# Patient Record
Sex: Male | Born: 1945
Health system: Southern US, Community
[De-identification: ages and names within clinical notes are randomized; demographics above are authoritative.]

## PROBLEM LIST (undated history)

## (undated) DIAGNOSIS — R0683 Snoring: Secondary | ICD-10-CM

## (undated) DIAGNOSIS — Z460 Encounter for fitting and adjustment of spectacles and contact lenses: Secondary | ICD-10-CM

## (undated) DIAGNOSIS — C9110 Chronic lymphocytic leukemia of B-cell type not having achieved remission: Secondary | ICD-10-CM

## (undated) DIAGNOSIS — E785 Hyperlipidemia, unspecified: Secondary | ICD-10-CM

## (undated) DIAGNOSIS — I10 Essential (primary) hypertension: Secondary | ICD-10-CM

## (undated) DIAGNOSIS — K219 Gastro-esophageal reflux disease without esophagitis: Secondary | ICD-10-CM

## (undated) DIAGNOSIS — D72820 Lymphocytosis (symptomatic): Principal | ICD-10-CM

## (undated) DIAGNOSIS — H269 Unspecified cataract: Secondary | ICD-10-CM

## (undated) HISTORY — DX: Lymphocytosis (symptomatic): D72.820

## (undated) HISTORY — DX: Unspecified cataract: H26.9

## (undated) HISTORY — DX: Essential (primary) hypertension: I10

## (undated) HISTORY — PX: COLONOSCOPY: SHX174

## (undated) HISTORY — DX: Hyperlipidemia, unspecified: E78.5

## (undated) HISTORY — DX: Chronic lymphocytic leukemia of B-cell type not having achieved remission: C91.10

## (undated) HISTORY — PX: TONSILLECTOMY: SUR1361

---

## 2008-07-19 ENCOUNTER — Ambulatory Visit: Payer: Self-pay | Admitting: Oncology

## 2008-08-06 ENCOUNTER — Other Ambulatory Visit: Admission: RE | Admit: 2008-08-06 | Discharge: 2008-08-06 | Payer: Self-pay | Admitting: Oncology

## 2008-08-06 ENCOUNTER — Encounter (HOSPITAL_COMMUNITY): Payer: Self-pay | Admitting: Oncology

## 2008-08-06 LAB — CBC WITH DIFFERENTIAL/PLATELET
BASO%: 0.3 % (ref 0.0–2.0)
Eosinophils Absolute: 0.2 10*3/uL (ref 0.0–0.5)
LYMPH%: 42.2 % (ref 14.0–48.0)
MONO#: 0.7 10*3/uL (ref 0.1–0.9)
NEUT#: 4.8 10*3/uL (ref 1.5–6.5)
Platelets: 218 10*3/uL (ref 145–400)
RBC: 4.79 10*6/uL (ref 4.20–5.71)
RDW: 13.3 % (ref 11.2–14.6)
WBC: 9.8 10*3/uL (ref 4.0–10.0)
lymph#: 4.1 10*3/uL — ABNORMAL HIGH (ref 0.9–3.3)

## 2008-08-06 LAB — COMPREHENSIVE METABOLIC PANEL
ALT: 15 U/L (ref 0–53)
AST: 18 U/L (ref 0–37)
Albumin: 4.5 g/dL (ref 3.5–5.2)
Alkaline Phosphatase: 57 U/L (ref 39–117)
Calcium: 9.4 mg/dL (ref 8.4–10.5)
Chloride: 105 mEq/L (ref 96–112)
Potassium: 4.2 mEq/L (ref 3.5–5.3)
Sodium: 142 mEq/L (ref 135–145)

## 2008-08-06 LAB — CHCC SMEAR

## 2008-08-14 LAB — FLOW CYTOMETRY

## 2008-10-03 ENCOUNTER — Ambulatory Visit: Payer: Self-pay | Admitting: Oncology

## 2008-10-07 LAB — CBC WITH DIFFERENTIAL/PLATELET
BASO%: 0.3 % (ref 0.0–2.0)
Basophils Absolute: 0 10*3/uL (ref 0.0–0.1)
HCT: 44.4 % (ref 38.7–49.9)
HGB: 15.1 g/dL (ref 13.0–17.1)
LYMPH%: 44 % (ref 14.0–48.0)
MCHC: 34 g/dL (ref 32.0–35.9)
MONO#: 0.7 10*3/uL (ref 0.1–0.9)
NEUT%: 46.4 % (ref 40.0–75.0)
Platelets: 233 10*3/uL (ref 145–400)
WBC: 12.1 10*3/uL — ABNORMAL HIGH (ref 4.0–10.0)

## 2008-12-03 ENCOUNTER — Ambulatory Visit: Payer: Self-pay | Admitting: Oncology

## 2008-12-05 LAB — CBC WITH DIFFERENTIAL/PLATELET
BASO%: 0.2 % (ref 0.0–2.0)
LYMPH%: 42.8 % (ref 14.0–49.0)
MCHC: 34 g/dL (ref 32.0–36.0)
MCV: 93.2 fL (ref 79.3–98.0)
MONO%: 4.8 % (ref 0.0–14.0)
Platelets: 222 10*3/uL (ref 140–400)
RBC: 4.57 10*6/uL (ref 4.20–5.82)

## 2008-12-05 LAB — COMPREHENSIVE METABOLIC PANEL
ALT: 14 U/L (ref 0–53)
AST: 16 U/L (ref 0–37)
CO2: 20 mEq/L (ref 19–32)
Creatinine, Ser: 1.52 mg/dL — ABNORMAL HIGH (ref 0.40–1.50)
Total Bilirubin: 0.7 mg/dL (ref 0.3–1.2)

## 2008-12-05 LAB — LACTATE DEHYDROGENASE: LDH: 166 U/L (ref 94–250)

## 2008-12-11 ENCOUNTER — Ambulatory Visit: Payer: Self-pay | Admitting: Internal Medicine

## 2008-12-11 DIAGNOSIS — C9110 Chronic lymphocytic leukemia of B-cell type not having achieved remission: Secondary | ICD-10-CM | POA: Insufficient documentation

## 2008-12-11 DIAGNOSIS — E785 Hyperlipidemia, unspecified: Secondary | ICD-10-CM

## 2008-12-11 DIAGNOSIS — I10 Essential (primary) hypertension: Secondary | ICD-10-CM | POA: Insufficient documentation

## 2009-02-25 ENCOUNTER — Ambulatory Visit: Payer: Self-pay | Admitting: Oncology

## 2009-02-27 LAB — CBC WITH DIFFERENTIAL/PLATELET
Basophils Absolute: 0 10*3/uL (ref 0.0–0.1)
Eosinophils Absolute: 0.1 10*3/uL (ref 0.0–0.5)
HGB: 14.4 g/dL (ref 13.0–17.1)
LYMPH%: 45 % (ref 14.0–49.0)
MCV: 93.6 fL (ref 79.3–98.0)
MONO%: 6.2 % (ref 0.0–14.0)
NEUT#: 3.9 10*3/uL (ref 1.5–6.5)
Platelets: 214 10*3/uL (ref 140–400)

## 2009-03-03 ENCOUNTER — Ambulatory Visit: Payer: Self-pay | Admitting: Internal Medicine

## 2009-03-03 DIAGNOSIS — K5900 Constipation, unspecified: Secondary | ICD-10-CM | POA: Insufficient documentation

## 2009-03-03 DIAGNOSIS — N400 Enlarged prostate without lower urinary tract symptoms: Secondary | ICD-10-CM

## 2009-05-23 ENCOUNTER — Ambulatory Visit: Payer: Self-pay | Admitting: Oncology

## 2009-05-27 LAB — CBC WITH DIFFERENTIAL/PLATELET
Basophils Absolute: 0 10*3/uL (ref 0.0–0.1)
EOS%: 0.9 % (ref 0.0–7.0)
HGB: 14.8 g/dL (ref 13.0–17.1)
LYMPH%: 43.6 % (ref 14.0–49.0)
MCH: 32.1 pg (ref 27.2–33.4)
MCV: 94 fL (ref 79.3–98.0)
MONO%: 5.8 % (ref 0.0–14.0)
NEUT%: 49.4 % (ref 39.0–75.0)
RDW: 13.5 % (ref 11.0–14.6)

## 2009-05-27 LAB — COMPREHENSIVE METABOLIC PANEL
AST: 16 U/L (ref 0–37)
Alkaline Phosphatase: 54 U/L (ref 39–117)
BUN: 24 mg/dL — ABNORMAL HIGH (ref 6–23)
Creatinine, Ser: 1.46 mg/dL (ref 0.40–1.50)
Potassium: 4.5 mEq/L (ref 3.5–5.3)
Total Bilirubin: 0.6 mg/dL (ref 0.3–1.2)

## 2009-08-22 ENCOUNTER — Ambulatory Visit: Payer: Self-pay | Admitting: Oncology

## 2009-08-26 LAB — CBC WITH DIFFERENTIAL/PLATELET
Basophils Absolute: 0 10*3/uL (ref 0.0–0.1)
EOS%: 1.6 % (ref 0.0–7.0)
HCT: 44.3 % (ref 38.4–49.9)
HGB: 14.9 g/dL (ref 13.0–17.1)
MCH: 32.4 pg (ref 27.2–33.4)
MCV: 96.2 fL (ref 79.3–98.0)
MONO%: 4.9 % (ref 0.0–14.0)
NEUT%: 50.2 % (ref 39.0–75.0)
lymph#: 4.1 10*3/uL — ABNORMAL HIGH (ref 0.9–3.3)

## 2009-11-03 ENCOUNTER — Ambulatory Visit: Payer: Self-pay | Admitting: Internal Medicine

## 2009-11-03 DIAGNOSIS — R05 Cough: Secondary | ICD-10-CM

## 2009-11-06 ENCOUNTER — Ambulatory Visit: Payer: Self-pay | Admitting: Oncology

## 2009-11-10 LAB — CBC WITH DIFFERENTIAL/PLATELET
Basophils Absolute: 0 10*3/uL (ref 0.0–0.1)
Eosinophils Absolute: 0.1 10*3/uL (ref 0.0–0.5)
HCT: 43.7 % (ref 38.4–49.9)
HGB: 14.6 g/dL (ref 13.0–17.1)
LYMPH%: 44.7 % (ref 14.0–49.0)
MCV: 93.5 fL (ref 79.3–98.0)
MONO#: 0.7 10*3/uL (ref 0.1–0.9)
MONO%: 6 % (ref 0.0–14.0)
NEUT#: 5.6 10*3/uL (ref 1.5–6.5)
NEUT%: 48.1 % (ref 39.0–75.0)
Platelets: 318 10*3/uL (ref 140–400)
RBC: 4.67 10*6/uL (ref 4.20–5.82)
WBC: 11.6 10*3/uL — ABNORMAL HIGH (ref 4.0–10.3)

## 2009-11-10 LAB — COMPREHENSIVE METABOLIC PANEL
Alkaline Phosphatase: 58 U/L (ref 39–117)
BUN: 24 mg/dL — ABNORMAL HIGH (ref 6–23)
CO2: 22 mEq/L (ref 19–32)
Glucose, Bld: 68 mg/dL — ABNORMAL LOW (ref 70–99)
Total Bilirubin: 0.6 mg/dL (ref 0.3–1.2)

## 2009-11-10 LAB — LACTATE DEHYDROGENASE: LDH: 170 U/L (ref 94–250)

## 2010-01-06 ENCOUNTER — Telehealth: Payer: Self-pay | Admitting: Internal Medicine

## 2010-02-16 ENCOUNTER — Ambulatory Visit: Payer: Self-pay | Admitting: Oncology

## 2010-02-19 LAB — CBC WITH DIFFERENTIAL/PLATELET
Basophils Absolute: 0 10*3/uL (ref 0.0–0.1)
Eosinophils Absolute: 0 10*3/uL (ref 0.0–0.5)
HGB: 14.5 g/dL (ref 13.0–17.1)
MCV: 93.5 fL (ref 79.3–98.0)
MONO#: 0.7 10*3/uL (ref 0.1–0.9)
MONO%: 7.1 % (ref 0.0–14.0)
NEUT#: 5.1 10*3/uL (ref 1.5–6.5)
RDW: 14.1 % (ref 11.0–14.6)
WBC: 10.1 10*3/uL (ref 4.0–10.3)

## 2010-05-06 ENCOUNTER — Ambulatory Visit: Payer: Self-pay | Admitting: Oncology

## 2010-05-08 LAB — CBC WITH DIFFERENTIAL/PLATELET
Basophils Absolute: 0 10*3/uL (ref 0.0–0.1)
HCT: 42.4 % (ref 38.4–49.9)
MCH: 32.3 pg (ref 27.2–33.4)
MCHC: 34.3 g/dL (ref 32.0–36.0)
MONO#: 0.6 10*3/uL (ref 0.1–0.9)
MONO%: 6.4 % (ref 0.0–14.0)
NEUT#: 4.8 10*3/uL (ref 1.5–6.5)
Platelets: 219 10*3/uL (ref 140–400)
RDW: 13.3 % (ref 11.0–14.6)
WBC: 8.9 10*3/uL (ref 4.0–10.3)

## 2010-05-08 LAB — COMPREHENSIVE METABOLIC PANEL
Albumin: 4.1 g/dL (ref 3.5–5.2)
CO2: 26 mEq/L (ref 19–32)
Creatinine, Ser: 1.29 mg/dL (ref 0.40–1.50)
Sodium: 140 mEq/L (ref 135–145)
Total Bilirubin: 1.3 mg/dL — ABNORMAL HIGH (ref 0.3–1.2)

## 2010-08-31 ENCOUNTER — Encounter: Payer: Self-pay | Admitting: Internal Medicine

## 2010-08-31 ENCOUNTER — Ambulatory Visit: Payer: Self-pay | Admitting: Internal Medicine

## 2010-08-31 LAB — CONVERTED CEMR LAB
ALT: 17 U/L
AST: 21 U/L
Albumin: 4.2 g/dL
Alkaline Phosphatase: 57 U/L
BUN: 20 mg/dL
Basophils Absolute: 0 10*3/uL
Basophils Relative: 0.3 %
Bilirubin, Direct: 0.1 mg/dL
CO2: 28 meq/L
Calcium: 9.4 mg/dL
Chloride: 104 meq/L
Cholesterol: 162 mg/dL
Creatinine, Ser: 1.2 mg/dL
Eosinophils Absolute: 0.2 10*3/uL
Eosinophils Relative: 2 %
GFR calc non Af Amer: 63.05 mL/min
Glucose, Bld: 88 mg/dL
HCT: 45.1 %
HDL: 34.7 mg/dL — ABNORMAL LOW
Hemoglobin: 15.4 g/dL
LDL Cholesterol: 101 mg/dL — ABNORMAL HIGH
Lymphocytes Relative: 46.4 % — ABNORMAL HIGH
Lymphs Abs: 4.3 10*3/uL — ABNORMAL HIGH
MCHC: 34.1 g/dL
MCV: 94.9 fL
Monocytes Absolute: 0.5 10*3/uL
Monocytes Relative: 5.3 %
Neutro Abs: 4.3 10*3/uL
Neutrophils Relative %: 46 %
PSA: 0.61 ng/mL
Platelets: 217 10*3/uL
Potassium: 4.7 meq/L
RBC: 4.75 M/uL
RDW: 13.3 %
Sodium: 138 meq/L
TSH: 1.42 u[IU]/mL
Total Bilirubin: 1.2 mg/dL
Total CHOL/HDL Ratio: 5
Total Protein: 6.6 g/dL
Triglycerides: 134 mg/dL
VLDL: 26.8 mg/dL
WBC: 9.3 10*3/uL

## 2010-10-25 LAB — CONVERTED CEMR LAB
Albumin: 4.2 g/dL (ref 3.5–5.2)
BUN: 16 mg/dL (ref 6–23)
Cholesterol: 159 mg/dL (ref 0–200)
Creatinine, Ser: 1.2 mg/dL (ref 0.4–1.5)
GFR calc non Af Amer: 64.56 mL/min (ref >60)
Glucose, Bld: 96 mg/dL (ref 70–99)
Hemoglobin, Urine: NEGATIVE
Ketones, ur: NEGATIVE mg/dL
LDL Cholesterol: 99 mg/dL (ref 0–99)
Specific Gravity, Urine: >=1.03 (ref 1.000–1.030)
TSH: 1.39 microintl units/mL (ref 0.35–5.50)
Total Bilirubin: 0.9 mg/dL (ref 0.3–1.2)
Total Protein, Urine: NEGATIVE mg/dL
Urine Glucose: NEGATIVE mg/dL
Urobilinogen, UA: 0.2 (ref 0.0–1.0)
pH: 5.5 (ref 5.0–8.0)

## 2010-10-27 NOTE — Assessment & Plan Note (Signed)
Summary: cold sx/cd   Vital Signs:  Patient profile:   65 year old male Height:      71 inches Weight:      176 pounds BMI:     24.64 O2 Sat:      96 % on Room air Temp:     99.2 degrees F oral Pulse rate:   79 / minute Pulse rhythm:   regular Resp:     16 per minute BP sitting:   104 / 62  (left arm) Cuff size:   large  Vitals Entered By: Rock Nephew CMA (November 03, 2009 9:55 AM)  O2 Flow:  Room air CC: congestion, headache, fever, cough, URI symptoms Is Patient Diabetic? No   Primary Care Provider:  Etta Grandchild MD  CC:  congestion, headache, fever, cough, and URI symptoms.  History of Present Illness:  URI Symptoms      This is a 65 year old man who presents with URI symptoms.  The symptoms began 3 days ago.  The severity is described as mild.  The patient reports nasal congestion, clear nasal discharge, sore throat, dry cough, and sick contacts, but denies purulent nasal discharge.  Associated symptoms include low-grade fever (<100.5 degrees), use of an antipyretic, and response to antipyretic.  The patient denies stiff neck, dyspnea, wheezing, rash, vomiting, and diarrhea.  The patient also reports headache.  The patient denies muscle aches and severe fatigue.  Risk factors for Strep sinusitis include Strep exposure.  The patient denies the following risk factors for Strep sinusitis: double sickening, tender adenopathy, and absence of cough.    Allergies: No Known Drug Allergies  Past History:  Past Medical History: Reviewed history from 12/11/2008 and no changes required. Hyperlipidemia Hypertension CLL  Past Surgical History: Reviewed history from 12/11/2008 and no changes required. Denies surgical history  Family History: Reviewed history from 12/11/2008 and no changes required. Family History Hypertension Family History Lung cancer  Social History: Reviewed history from 12/11/2008 and no changes required. 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 Married Never Smoked Alcohol use-no Drug use-no Regular exercise-yes  Review of Systems  The patient denies anorexia, fever, weight loss, chest pain, hemoptysis, abdominal pain, enlarged lymph nodes, and angioedema.    Physical Exam  General:  Well-developed,well-nourished,in no acute distress; alert,appropriate and cooperative throughout examination Head:  Normocephalic and atraumatic without obvious abnormalities. No apparent alopecia or balding. Eyes:  No corneal or conjunctival inflammation noted. EOMI. Perrla. Funduscopic exam benign, without hemorrhages, exudates or papilledema. Vision grossly normal. Mouth:  Oral mucosa and oropharynx without lesions or exudates.  Teeth in good repair. Neck:  No deformities, masses, or tenderness noted. Lungs:  Normal respiratory effort, chest expands symmetrically. Lungs are clear to auscultation, no crackles or wheezes. Heart:  Normal rate and regular rhythm. S1 and S2 normal without gallop, murmur, click, rub or other extra sounds. Abdomen:  soft, non-tender, normal bowel sounds, no distention, no masses, no guarding, no rigidity, no rebound tenderness, no abdominal hernia, no inguinal hernia, no hepatomegaly, and no splenomegaly.   Msk:  No deformity or scoliosis noted of thoracic or lumbar spine.   Pulses:  R and L carotid,radial,femoral,dorsalis pedis and posterior tibial pulses are full and equal bilaterally Extremities:  No clubbing, cyanosis, edema, or deformity noted with normal full range of motion of all joints.   Neurologic:  No cranial nerve deficits noted. Station and gait are normal. Plantar reflexes are down-going bilaterally. DTRs are symmetrical throughout. Sensory, motor and  coordinative functions appear intact. Skin:  turgor normal, color normal, no rashes, no suspicious lesions, no ecchymoses, no petechiae, no ulcerations, and no edema.   Cervical Nodes:  No lymphadenopathy noted Axillary Nodes:  No palpable  lymphadenopathy Psych:  Cognition and judgment appear intact. Alert and cooperative with normal attention span and concentration. No apparent delusions, illusions, hallucinations   Impression & Recommendations:  Problem # 1:  COUGH (ICD-786.2) Assessment New  Orders: T-2 View CXR (71020TC)  Problem # 2:  BRONCHITIS-ACUTE (ICD-466.0) Assessment: New  His updated medication list for this problem includes:    Avelox Abc Pack 400 Mg Tabs (Moxifloxacin hcl) ..... One by mouth once daily for 5 days  Complete Medication List: 1)  Amlodipine Besy-benazepril Hcl 5-20 Mg Caps (Amlodipine besy-benazepril hcl) .Marland Kitchen.. 1 once daily 2)  Lipitor 10 Mg Tabs (Atorvastatin calcium) .Marland Kitchen.. 1 once daily 3)  Baby Asa  4)  Avelox Abc Pack 400 Mg Tabs (Moxifloxacin hcl) .... One by mouth once daily for 5 days  Patient Instructions: 1)  Please schedule a follow-up appointment in 2 weeks. 2)  Take your antibiotic as prescribed until ALL of it is gone, but stop if you develop a rash or swelling and contact our office as soon as possible. 3)  Acute bronchitis symptoms for less than 10 days are not helped by antibiotics. take over the counter cough medications. call if no improvment in  5-7 days, sooner if increasing cough, fever, or new symptoms( shortness of breath, chest pain). Prescriptions: AVELOX ABC PACK 400 MG TABS (MOXIFLOXACIN HCL) one by mouth once daily for 5 days  #5 x 0   Entered and Authorized by:   Etta Grandchild MD   Signed by:   Etta Grandchild MD on 11/03/2009   Method used:   Samples Given   RxID:   [id number]

## 2010-10-27 NOTE — Assessment & Plan Note (Signed)
Summary: CPX/ LABS AFTER/ BCBS /NWS   Vital Signs:  Patient profile:   65 year old male Height:      71 inches Weight:      174 pounds BMI:     24.36 O2 Sat:      96 % on Room air Temp:     97.6 degrees F oral Pulse rate:   56 / minute Pulse rhythm:   regular Resp:     16 per minute BP sitting:   120 / 70  (left arm) Cuff size:   large  Vitals Entered By: Rock Nephew CMA (August 31, 2010 8:50 AM)  O2 Flow:  Room air CC: CPX w/labs, Hypertension Management, Lipid Management, Preventive Care Is Patient Diabetic? No Pain Assessment Patient in pain? no       Does patient need assistance? Functional Status Self care Ambulation Normal   Primary Care Provider:  Etta Grandchild MD  CC:  CPX w/labs, Hypertension Management, Lipid Management, and Preventive Care.  History of Present Illness: Here for Medicare AWV:  1.   Risk factors based on Past M, S, F history: yes 2.   Physical Activities: very active 3.   Depression/mood: his mood is very good 4.   Hearing: he hears the whispered voice at 3 feet 5.   ADL's: thorough and independent 6.   Fall Risk: none noted 7.   Home Safety: very good 8.   Height, weight, &visual acuity: done 9.   Counseling: yes 10.   Labs ordered based on risk factors: done 11.           Referral Coordination: none at this time 12.           Care Plan: completed 13.            Cognitive Assessment : he responds appropriately to all questions  Hypertension History:      He denies headache, chest pain, palpitations, dyspnea with exertion, orthopnea, PND, peripheral edema, visual symptoms, neurologic problems, syncope, and side effects from treatment.  He notes no problems with any antihypertensive medication side effects.        Positive major cardiovascular risk factors include male age 49 years old or older, hyperlipidemia, and hypertension.  Negative major cardiovascular risk factors include no history of diabetes, negative family history  for ischemic heart disease, and non-tobacco-user status.        Further assessment for target organ damage reveals no history of ASHD, cardiac end-organ damage (CHF/LVH), stroke/TIA, peripheral vascular disease, renal insufficiency, or hypertensive retinopathy.    Lipid Management History:      Positive NCEP/ATP III risk factors include male age 44 years old or older, HDL cholesterol less than 40, and hypertension.  Negative NCEP/ATP III risk factors include non-diabetic, no family history for ischemic heart disease, non-tobacco-user status, no ASHD (atherosclerotic heart disease), no prior stroke/TIA, no peripheral vascular disease, and no history of aortic aneurysm.        The patient states that he knows about the "Therapeutic Lifestyle Change" diet.  His compliance with the TLC diet is excellent.  The patient expresses understanding of adjunctive measures for cholesterol lowering.  Adjunctive measures started by the patient include aerobic exercise, fiber, ASA, omega-3 supplements, limit alcohol consumpton, and weight reduction.  He expresses no side effects from his lipid-lowering medication.  The patient denies any symptoms to suggest myopathy or liver disease.      Current Medications (verified): 1)  Amlodipine Besy-Benazepril Hcl 5-20 Mg  Caps (Amlodipine Besy-Benazepril Hcl) .Marland Kitchen.. 1 Once Daily 2)  Lipitor 10 Mg Tabs (Atorvastatin Calcium) .Marland Kitchen.. 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 Family History Hypertension Family History Lung cancer  Social History: Reviewed history from 12/11/2008 and no changes required. 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   Heme:  Denies abnormal bruising, bleeding, enlarge lymph nodes, fevers, pallor, and skin discoloration.  Physical Exam  General:  Well-developed,well-nourished,in no acute distress; alert,appropriate and cooperative throughout examination Head:  Normocephalic and atraumatic without obvious abnormalities. No apparent alopecia or balding. Eyes:  No corneal or conjunctival inflammation noted. EOMI. Perrla. Funduscopic exam benign, without hemorrhages, exudates or papilledema. Vision grossly normal. Mouth:  Oral mucosa and oropharynx without lesions or exudates.  Teeth in good repair. Neck:  No deformities, masses, or tenderness noted. Breasts:  No masses or gynecomastia noted Lungs:  Normal respiratory effort, chest expands symmetrically. Lungs are clear to auscultation, no crackles or wheezes. Heart:  Normal rate and regular rhythm. S1 and S2 normal without gallop, murmur, click, rub or other extra sounds. Abdomen:  soft, non-tender, normal bowel sounds, no distention, no masses, no guarding, no rigidity, no rebound tenderness, no abdominal hernia, no inguinal hernia, no hepatomegaly, and no splenomegaly.   Rectal:  No external abnormalities noted. Normal sphincter  tone. No rectal masses or tenderness. Genitalia:  circumcised, no hydrocele, no varicocele, no scrotal masses, no testicular masses or atrophy, no cutaneous lesions, and no urethral discharge.   Prostate:  no nodules, no asymmetry, no induration, and 1+ enlarged.   Msk:  normal ROM, no joint tenderness, no joint swelling, no joint warmth, no redness over joints, no joint deformities, no joint instability, and no crepitation.   Pulses:  R and L carotid,radial,femoral,dorsalis pedis and posterior tibial pulses are full and equal bilaterally Extremities:  No clubbing, cyanosis, edema, or deformity noted with normal full range of motion of all joints.   Neurologic:  No cranial nerve deficits noted. Station and gait are normal. Plantar reflexes are down-going bilaterally. DTRs are symmetrical throughout. Sensory, motor and coordinative functions appear intact. Skin:  turgor normal, color normal, no rashes, no suspicious lesions, no ecchymoses, no petechiae, no purpura, no ulcerations, no edema, excessive tan, and solar damage.   Cervical Nodes:  no anterior cervical adenopathy and no posterior cervical adenopathy.   Axillary Nodes:  no R axillary adenopathy and no L axillary adenopathy.   Inguinal Nodes:  no R inguinal adenopathy and no L inguinal adenopathy.   Psych:  Cognition and judgment appear intact. Alert and cooperative with normal attention span and concentration. No apparent delusions, illusions, hallucinations   Impression & Recommendations:  Problem # 1:  ROUTINE GENERAL MEDICAL EXAM@HEALTH  CARE FACL (ICD-V70.0) Assessment New  Colonoscopy: Normal (03/09/2006) Td Booster: Td (03/27/2008)   Flu Vax: Fluvax 3+ (08/31/2010)   Pneumovax: Pneumovax (12/11/2008) Chol: 159 (03/03/2009)   HDL: 39.60 (03/03/2009)   LDL: 99 (03/03/2009)   TG: 100.0 (03/03/2009) TSH: 1.39 (03/03/2009)   PSA: 0.56 (03/03/2009)  Discussed using sunscreen, use of alcohol, drug use, self testicular exam, routine  dental care, routine eye care, routine physical exam, seat belts, multiple vitamins, osteoporosis prevention, adequate calcium intake in diet, and recommendations for immunizations.  Discussed exercise and checking cholesterol.  Also recommend checking PSA.  Orders: [facility] -Subsequent Annual Wellness Visit [phone]) DRE [phone]) Prostate / PSA (Medicare) (G0103) Hemoccult Guaiac-1 spec.(in office) (82270)  Problem # 2:  HYPERTROPHY PROSTATE W/UR OBST & OTH LUTS (ICD-600.01) Assessment: Unchanged  Orders: Venipuncture (14782) TLB-Lipid Panel (80061-LIPID) TLB-BMP (Basic Metabolic Panel-BMET) (80048-METABOL) TLB-CBC Platelet - w/Differential (85025-CBCD) TLB-Hepatic/Liver Function Pnl (80076-HEPATIC) TLB-TSH (Thyroid Stimulating Hormone) (84443-TSH) TLB-PSA (Prostate Specific Antigen) (84153-PSA) Hemoccult Guaiac-1 spec.(in office) (82270)  Problem # 3:  HYPERTENSION (ICD-401.9) Assessment: Improved  His updated medication list for this problem includes:    Amlodipine Besy-benazepril Hcl 5-20 Mg Caps (Amlodipine besy-benazepril hcl) .Marland Kitchen... 1 once daily  Orders: Venipuncture (95621) TLB-Lipid Panel (80061-LIPID) TLB-BMP (Basic Metabolic Panel-BMET) (80048-METABOL) TLB-CBC Platelet - w/Differential (85025-CBCD) TLB-Hepatic/Liver Function Pnl (80076-HEPATIC) TLB-TSH (Thyroid Stimulating Hormone) (84443-TSH) TLB-PSA (Prostate Specific Antigen) (84153-PSA)  BP today: 120/70 Prior BP: 104/62 (11/03/2009)  10 Yr Risk Heart Disease: 9 % Prior 10 Yr Risk Heart Disease: Not enough information (12/11/2008)  Labs Reviewed: K+: 4.7 (03/03/2009) Creat: : 1.2 (03/03/2009)   Chol: 159 (03/03/2009)   HDL: 39.60 (03/03/2009)   LDL: 99 (03/03/2009)   TG: 100.0 (03/03/2009)  Problem # 4:  HYPERLIPIDEMIA (ICD-272.4) Assessment: Deteriorated  His updated medication list for this problem includes:    Lipitor 10 Mg Tabs (Atorvastatin calcium) .Marland Kitchen... 1 once daily  Orders: Venipuncture  (30865) TLB-Lipid Panel (80061-LIPID) TLB-BMP (Basic Metabolic Panel-BMET) (80048-METABOL) TLB-CBC Platelet - w/Differential (85025-CBCD) TLB-Hepatic/Liver Function Pnl (80076-HEPATIC) TLB-TSH (Thyroid Stimulating Hormone) (84443-TSH) TLB-PSA (Prostate Specific Antigen) (84153-PSA)  Labs Reviewed: SGOT: 25 (03/03/2009)   SGPT: 20 (03/03/2009)  Lipid Goals: Chol Goal: 200 (03/03/2009)   HDL Goal: 40 (03/03/2009)   LDL Goal: 130 (03/03/2009)   TG Goal: 150 (03/03/2009)  10 Yr Risk Heart Disease: 9 % Prior 10 Yr Risk Heart Disease: Not enough information (12/11/2008)   HDL:39.60 (03/03/2009)  LDL:99 (03/03/2009)  Chol:159 (03/03/2009)  Trig:100.0 (03/03/2009)  Complete Medication List: 1)  Amlodipine Besy-benazepril Hcl 5-20 Mg Caps (Amlodipine besy-benazepril hcl) .Marland Kitchen.. 1 once daily 2)  Lipitor 10 Mg Tabs (Atorvastatin calcium) .Marland Kitchen.. 1 once daily 3)  Baby Asa   Other Orders: Flu Vaccine 11yrs + MEDICARE PATIENTS (H8469) Administration Flu vaccine - MCR (G2952)  Hypertension Assessment/Plan:      The patient's hypertensive risk group is category B: At least one risk factor (excluding diabetes) with no target organ damage.  His calculated 10 year risk of coronary heart disease is 9 %.  Today's blood pressure is 120/70.  His blood pressure goal is < 140/90.  Lipid Assessment/Plan:      Based on NCEP/ATP III, the patient's risk factor category is "2 or more risk factors and a calculated 10 year CAD risk of < 20%".  The patient's lipid goals are as follows: Total cholesterol goal is 200; LDL cholesterol goal is 130; HDL cholesterol goal is 40; Triglyceride goal is 150.    Colorectal Screening:  Current Recommendations:  Hemoccult: NEG X 1 today  PSA Screening:    PSA: 0.56  (03/03/2009)    Reviewed PSA screening recommendations: PSA ordered  Immunization & Chemoprophylaxis:    Tetanus vaccine: Td  (03/27/2008)    Influenza vaccine: Fluvax 3+  (08/31/2010)    Pneumovax: Pneumovax   (12/11/2008)  Patient Instructions: 1)  Please schedule a follow-up appointment in 4 months. 2)  It is important that you exercise regularly at least 20 minutes 5 times a week. If you develop chest pain, have severe difficulty breathing, or feel very tired , stop exercising immediately and seek medical attention. 3)  Check your Blood Pressure regularly. If it is above 140/90: you should make an appointment.   Orders Added: 1)  Venipuncture [36415] 2)  TLB-Lipid Panel [80061-LIPID] 3)  TLB-BMP (Basic Metabolic Panel-BMET) [80048-METABOL] 4)  TLB-CBC Platelet - w/Differential [85025-CBCD] 5)  TLB-Hepatic/Liver Function Pnl [80076-HEPATIC] 6)  TLB-TSH (Thyroid Stimulating Hormone) [84443-TSH] 7)  TLB-PSA (Prostate Specific Antigen) [84153-PSA] 8)  Flu Vaccine 60yrs + MEDICARE PATIENTS [Q2039] 9)  Administration Flu vaccine - MCR [G0008] 10)  [facility] -Subsequent Annual Wellness Visit [G0439] 11)  DRE [G0102] 12)  Prostate / PSA (Medicare) [G0103] 13)  Hemoccult Guaiac-1 spec.(in office) [82270]  Flu Vaccine Consent Questions     Do you have a history of severe allergic reactions to this vaccine? no    Any prior history of allergic reactions to egg and/or gelatin? no    Do you have a sensitivity to the preservative Thimersol? no    Do you have a past history of Guillan-Barre Syndrome? no    Do you currently have an acute febrile illness? no    Have you ever had a severe reaction to latex? no    Vaccine information given and explained to patient? yes    Are you currently pregnant? no    Lot Number:[id number]   Exp Date:03/27/2011   Site Given  Left Deltoid IM      .[email or web address]

## 2010-10-27 NOTE — Progress Notes (Signed)
Summary: Rash  Phone Note Call from Patient Call back at [phone]   Summary of Call: Pt c/o posion oak rash on both arms, hands & fingers x 3 days. Has used cortisone cream w/no relief. Patient is requesting rx for prednisone.  Initial call taken by: Lamar Sprinkles, CMA,  January 06, 2010 8:29 AM  Follow-up for Phone Call        done Follow-up by: Etta Grandchild MD,  January 06, 2010 9:02 AM  Additional Follow-up for Phone Call Additional follow up Details #1::        pt informed and was very thankful. Additional Follow-up by: Margaret Pyle, CMA,  January 06, 2010 9:47 AM    New/Updated Medications: PREDNISONE 20 MG TABS (PREDNISONE) 3 by mouth once daily for 3 days, then 2 by mouth once daily for 3 days, then 1 one once daily for 3 days Prescriptions: PREDNISONE 20 MG TABS (PREDNISONE) 3 by mouth once daily for 3 days, then 2 by mouth once daily for 3 days, then 1 one once daily for 3 days  #18 x 1   Entered and Authorized by:   Etta Grandchild MD   Signed by:   Etta Grandchild MD on 01/06/2010   Method used:   Electronically to        [id number] (retail)       [address]       [place], [place]  [address]       Ph: [phone] or [phone]       Fax: [phone]   RxID:   [id number]

## 2010-10-27 NOTE — Letter (Signed)
Summary: Results Follow-up Letter  [facility]  [address] [place], [place] [address]   Phone: [phone]  Fax: [phone]    08/31/2010  [address] [place], [place]  [address]  Dear Mr. WASHKO,   The following are the results of your recent test(s):  Test     Result     CBC       slightly high lymphocytes Liver/kidney   normal Prostate     normal Thyroid     normal   _________________________________________________________  Please call for an appointment as directed _________________________________________________________ _________________________________________________________ _________________________________________________________  Sincerely,  Sanda Linger MD [facility]

## 2010-10-27 NOTE — Letter (Signed)
Summary: Lipid Letter  [facility]  [address] [place], [place] [address]   Phone: [phone]  Fax: [phone]    08/31/2010  Larry Huang [address] [place], [place]  [address]  Dear Larry Huang:  We have carefully reviewed your last lipid profile from 08/31/2010 and the results are noted below with a summary of recommendations for lipid management.    Cholesterol:       162     Goal: <200   HDL "good" Cholesterol:   27.03     Goal: >40   LDL "bad" Cholesterol:   101     Goal: <130   Triglycerides:       134.0     Goal: <150        TLC Diet (Therapeutic Lifestyle Change): Saturated Fats & Transfatty acids should be kept < 7% of total calories ***Reduce Saturated Fats Polyunstaurated Fat can be up to 10% of total calories Monounsaturated Fat Fat can be up to 20% of total calories Total Fat should be no greater than 25-35% of total calories Carbohydrates should be 50-60% of total calories Protein should be approximately 15% of total calories Fiber should be at least 20-30 grams a day ***Increased fiber may help lower LDL Total Cholesterol should be < 200mg /day Consider adding plant stanol/sterols to diet (example: Benacol spread) ***A higher intake of unsaturated fat may reduce Triglycerides and Increase HDL    Adjunctive Measures (may lower LIPIDS and reduce risk of Heart Attack) include: Aerobic Exercise (20-30 minutes 3-4 times a week) Limit Alcohol Consumption Weight Reduction Aspirin 75-81 mg a day by mouth (if not allergic or contraindicated) Dietary Fiber 20-30 grams a day by mouth     Current Medications: 1)    Amlodipine Besy-benazepril Hcl 5-20 Mg Caps (Amlodipine besy-benazepril hcl) .Marland Kitchen.. 1 once daily 2)    Lipitor 10 Mg Tabs (Atorvastatin calcium) .Marland Kitchen.. 1 once daily 3)    Baby Asa   If you have any questions, please call. We appreciate being able to work with you.   Sincerely,    [facility] Etta Grandchild MD

## 2010-11-09 ENCOUNTER — Encounter (HOSPITAL_BASED_OUTPATIENT_CLINIC_OR_DEPARTMENT_OTHER): Payer: BC Managed Care – PPO | Admitting: Oncology

## 2010-11-09 ENCOUNTER — Other Ambulatory Visit (HOSPITAL_COMMUNITY): Payer: Self-pay | Admitting: Oncology

## 2010-11-09 DIAGNOSIS — C9110 Chronic lymphocytic leukemia of B-cell type not having achieved remission: Secondary | ICD-10-CM

## 2010-11-09 LAB — CBC WITH DIFFERENTIAL/PLATELET
Eosinophils Absolute: 0.3 10*3/uL (ref 0.0–0.5)
HGB: 14.7 g/dL (ref 13.0–17.1)
LYMPH%: 40.9 % (ref 14.0–49.0)
NEUT%: 48.9 % (ref 39.0–75.0)
Platelets: 214 10*3/uL (ref 140–400)
RBC: 4.72 10*6/uL (ref 4.20–5.82)

## 2011-02-21 ENCOUNTER — Other Ambulatory Visit: Payer: Self-pay | Admitting: Internal Medicine

## 2011-05-07 ENCOUNTER — Other Ambulatory Visit (HOSPITAL_COMMUNITY): Payer: Self-pay | Admitting: Oncology

## 2011-05-07 ENCOUNTER — Encounter (HOSPITAL_BASED_OUTPATIENT_CLINIC_OR_DEPARTMENT_OTHER): Payer: Medicare Other | Admitting: Oncology

## 2011-05-07 DIAGNOSIS — C9110 Chronic lymphocytic leukemia of B-cell type not having achieved remission: Secondary | ICD-10-CM

## 2011-05-07 LAB — CBC WITH DIFFERENTIAL/PLATELET
BASO%: 0.3 % (ref 0.0–2.0)
Eosinophils Absolute: 0.2 10*3/uL (ref 0.0–0.5)
HCT: 42.1 % (ref 38.4–49.9)
LYMPH%: 46.5 % (ref 14.0–49.0)
MCHC: 34.7 g/dL (ref 32.0–36.0)
MONO#: 0.6 10*3/uL (ref 0.1–0.9)
NEUT#: 3.8 10*3/uL (ref 1.5–6.5)
NEUT%: 43.9 % (ref 39.0–75.0)
Platelets: 210 10*3/uL (ref 140–400)
RBC: 4.48 10*6/uL (ref 4.20–5.82)
WBC: 8.6 10*3/uL (ref 4.0–10.3)
lymph#: 4 10*3/uL — ABNORMAL HIGH (ref 0.9–3.3)

## 2011-05-07 LAB — COMPREHENSIVE METABOLIC PANEL
AST: 19 U/L (ref 0–37)
Alkaline Phosphatase: 51 U/L (ref 39–117)
BUN: 21 mg/dL (ref 6–23)
Creatinine, Ser: 1.32 mg/dL (ref 0.50–1.35)
Total Bilirubin: 0.7 mg/dL (ref 0.3–1.2)

## 2011-07-17 ENCOUNTER — Encounter: Payer: Self-pay | Admitting: Internal Medicine

## 2011-07-19 ENCOUNTER — Encounter: Payer: Self-pay | Admitting: Internal Medicine

## 2011-07-19 ENCOUNTER — Other Ambulatory Visit (INDEPENDENT_AMBULATORY_CARE_PROVIDER_SITE_OTHER): Payer: Medicare Other

## 2011-07-19 ENCOUNTER — Ambulatory Visit (INDEPENDENT_AMBULATORY_CARE_PROVIDER_SITE_OTHER): Payer: Medicare Other | Admitting: Internal Medicine

## 2011-07-19 DIAGNOSIS — N401 Enlarged prostate with lower urinary tract symptoms: Secondary | ICD-10-CM

## 2011-07-19 DIAGNOSIS — C9110 Chronic lymphocytic leukemia of B-cell type not having achieved remission: Secondary | ICD-10-CM

## 2011-07-19 DIAGNOSIS — I10 Essential (primary) hypertension: Secondary | ICD-10-CM

## 2011-07-19 DIAGNOSIS — N138 Other obstructive and reflux uropathy: Secondary | ICD-10-CM

## 2011-07-19 DIAGNOSIS — Z0000 Encounter for general adult medical examination without abnormal findings: Secondary | ICD-10-CM

## 2011-07-19 DIAGNOSIS — H43391 Other vitreous opacities, right eye: Secondary | ICD-10-CM

## 2011-07-19 DIAGNOSIS — H43399 Other vitreous opacities, unspecified eye: Secondary | ICD-10-CM

## 2011-07-19 DIAGNOSIS — E785 Hyperlipidemia, unspecified: Secondary | ICD-10-CM

## 2011-07-19 DIAGNOSIS — Z136 Encounter for screening for cardiovascular disorders: Secondary | ICD-10-CM | POA: Insufficient documentation

## 2011-07-19 LAB — CBC WITH DIFFERENTIAL/PLATELET
Basophils Relative: 0.2 % (ref 0.0–3.0)
Eosinophils Relative: 1.5 % (ref 0.0–5.0)
Lymphocytes Relative: 46.9 % — ABNORMAL HIGH (ref 12.0–46.0)
Neutrophils Relative %: 45.9 % (ref 43.0–77.0)
RBC: 4.91 Mil/uL (ref 4.22–5.81)
WBC: 10.8 10*3/uL — ABNORMAL HIGH (ref 4.5–10.5)

## 2011-07-19 LAB — URINALYSIS, ROUTINE W REFLEX MICROSCOPIC
Bilirubin Urine: NEGATIVE
Hgb urine dipstick: NEGATIVE
Leukocytes, UA: NEGATIVE
Nitrite: NEGATIVE
Total Protein, Urine: NEGATIVE

## 2011-07-19 LAB — COMPREHENSIVE METABOLIC PANEL
Albumin: 4.4 g/dL (ref 3.5–5.2)
BUN: 21 mg/dL (ref 6–23)
CO2: 29 mEq/L (ref 19–32)
Calcium: 9.5 mg/dL (ref 8.4–10.5)
Chloride: 103 mEq/L (ref 96–112)
GFR: 63.48 mL/min (ref >60.00)
Glucose, Bld: 91 mg/dL (ref 70–99)
Potassium: 4.9 mEq/L (ref 3.5–5.1)

## 2011-07-19 LAB — TSH: TSH: 1.35 u[IU]/mL (ref 0.35–5.50)

## 2011-07-19 LAB — LIPID PANEL
LDL Cholesterol: 102 mg/dL — ABNORMAL HIGH (ref 0–99)
VLDL: 29 mg/dL (ref 0.0–40.0)

## 2011-07-19 MED ORDER — ATORVASTATIN CALCIUM 10 MG PO TABS: 10.0000 mg | ORAL_TABLET | Freq: Every day | ORAL | Status: DC

## 2011-07-19 MED ORDER — AMLODIPINE BESY-BENAZEPRIL HCL 5-20 MG PO CAPS: 1.0000 | ORAL_CAPSULE | Freq: Every day | ORAL | Status: DC

## 2011-07-19 NOTE — Progress Notes (Signed)
Subjective:    Patient ID: Larry Huang, male    DOB: May 26, 1946, 65 y.o.   MRN: [id number]  Hypertension This is a chronic problem. The current episode started more than 1 year ago. The problem has been gradually improving since onset. The problem is controlled. Pertinent negatives include no anxiety, blurred vision, chest pain, headaches, malaise/fatigue, neck pain, orthopnea, palpitations, peripheral edema, PND, shortness of breath or sweats. There are no associated agents to hypertension. Past treatments include calcium channel blockers and ACE inhibitors. The current treatment provides significant improvement. There are no compliance problems.  There is no history of chronic renal disease.  Hyperlipidemia This is a chronic problem. The current episode started more than 1 year ago. The problem is controlled. Recent lipid tests were reviewed and are normal. He has no history of chronic renal disease, diabetes, hypothyroidism, liver disease, obesity or nephrotic syndrome. Factors aggravating his hyperlipidemia include no known factors. Pertinent negatives include no chest pain, focal sensory loss, focal weakness, leg pain, myalgias or shortness of breath. Current antihyperlipidemic treatment includes statins. The current treatment provides significant improvement of lipids. There are no compliance problems.       Review of Systems  Constitutional: Negative for fever, chills, malaise/fatigue, diaphoresis, activity change, appetite change, fatigue and unexpected weight change.  HENT: Negative for sore throat, facial swelling, neck pain and voice change.   Eyes: Positive for visual disturbance (he sneezed hard 1 week ago and has since had floaters in his right eye). Negative for blurred vision, photophobia, pain, discharge, redness and itching.  Respiratory: Negative for apnea, cough, choking, chest tightness, shortness of breath, wheezing and stridor.   Cardiovascular: Negative for chest pain,  palpitations, orthopnea, leg swelling and PND.  Gastrointestinal: Negative for nausea, vomiting, abdominal pain, diarrhea, constipation, blood in stool, abdominal distention, anal bleeding and rectal pain.  Genitourinary: Negative for dysuria, urgency, frequency, hematuria, flank pain, decreased urine volume, enuresis, difficulty urinating, genital sores and testicular pain.  Musculoskeletal: Negative for myalgias, back pain, joint swelling, arthralgias and gait problem.  Skin: Negative for color change, pallor, rash and wound.  Neurological: Negative for dizziness, tremors, focal weakness, seizures, syncope, facial asymmetry, speech difficulty, weakness, light-headedness, numbness and headaches.  Hematological: Negative for adenopathy. Does not bruise/bleed easily.  Psychiatric/Behavioral: Negative.        Objective:   Physical Exam  Vitals reviewed. Constitutional: He is oriented to person, place, and time. He appears well-developed and well-nourished. No distress.  HENT:  Head: Normocephalic and atraumatic.  Mouth/Throat: Oropharynx is clear and moist. No oropharyngeal exudate.  Eyes: Conjunctivae are normal. Right eye exhibits no discharge. Left eye exhibits no discharge. No scleral icterus.  Neck: Normal range of motion. Neck supple. No JVD present. No tracheal deviation present. No thyromegaly present.  Cardiovascular: Normal rate, regular rhythm, normal heart sounds and intact distal pulses.  Exam reveals no gallop and no friction rub.   No murmur heard. Pulmonary/Chest: Effort normal and breath sounds normal. No stridor. No respiratory distress. He has no wheezes. He has no rales. He exhibits no tenderness.  Abdominal: Soft. Bowel sounds are normal. He exhibits no distension and no mass. There is no tenderness. There is no rebound and no guarding. Hernia confirmed negative in the right inguinal area and confirmed negative in the left inguinal area.  Genitourinary: Rectum normal,  testes normal and penis normal. Rectal exam shows no external hemorrhoid, no internal hemorrhoid, no fissure, no mass, no tenderness and anal tone normal. Guaiac negative stool. Prostate  is not enlarged and not tender. Right testis shows no mass, no swelling and no tenderness. Right testis is descended. Left testis shows no mass, no swelling and no tenderness. Left testis is descended. Circumcised. No penile tenderness. No discharge found.  Musculoskeletal: Normal range of motion. He exhibits no edema and no tenderness.  Lymphadenopathy:    He has no cervical adenopathy.       Right: No inguinal adenopathy present.       Left: No inguinal adenopathy present.  Neurological: He is oriented to person, place, and time. He displays normal reflexes. He exhibits normal muscle tone. Coordination normal.  Skin: Skin is warm and dry. No rash noted. He is not diaphoretic. No erythema. No pallor.  Psychiatric: He has a normal mood and affect. His behavior is normal. Judgment and thought content normal.          Assessment & Plan:

## 2011-07-19 NOTE — Assessment & Plan Note (Signed)
I think he has a partially detached retina so I have referred him to ophthalmology

## 2011-07-19 NOTE — Assessment & Plan Note (Signed)
His BP is well controlled 

## 2011-07-19 NOTE — Assessment & Plan Note (Signed)

## 2011-07-19 NOTE — Assessment & Plan Note (Signed)
He has no symptoms and exam is stable today, I will check his PSA

## 2011-07-19 NOTE — Assessment & Plan Note (Signed)
I will check his CBC

## 2011-07-19 NOTE — Patient Instructions (Signed)
Health Maintenance, Males A healthy lifestyle and preventative care can promote health and wellness.  Maintain regular health, dental, and eye exams.   Eat a healthy diet. Foods like vegetables, fruits, whole grains, low-fat dairy products, and lean protein foods contain the nutrients you need without too many calories. Decrease your intake of foods high in solid fats, added sugars, and salt. Get information about a proper diet from your caregiver, if necessary.   Regular physical exercise is one of the most important things you can do for your health. Most adults should get at least 150 minutes of moderate-intensity exercise (any activity that increases your heart rate and causes you to sweat) each week. In addition, most adults need muscle-strengthening exercises on 2 or more days a week.    Maintain a healthy weight. The body mass index (BMI) is a screening tool to identify possible weight problems. It provides an estimate of body fat based on height and weight. Your caregiver can help determine your BMI, and can help you achieve or maintain a healthy weight. For adults 20 years and older:   A BMI below 18.5 is considered underweight.   A BMI of 18.5 to 24.9 is normal.   A BMI of 25 to 29.9 is considered overweight.   A BMI of 30 and above is considered obese.   Maintain normal blood lipids and cholesterol by exercising and minimizing your intake of saturated fat. Eat a balanced diet with plenty of fruits and vegetables. Blood tests for lipids and cholesterol should begin at age 20 and be repeated every 5 years. If your lipid or cholesterol levels are high, you are over 50, or you are a high risk for heart disease, you may need your cholesterol levels checked more frequently.Ongoing high lipid and cholesterol levels should be treated with medicines, if diet and exercise are not effective.   If you smoke, find out from your caregiver how to quit. If you do not use tobacco, do not start.    If you choose to drink alcohol, do not exceed 2 drinks per day. One drink is considered to be 12 ounces (355 mL) of beer, 5 ounces (148 mL) of wine, or 1.5 ounces (44 mL) of liquor.   Avoid use of street drugs. Do not share needles with anyone. Ask for help if you need support or instructions about stopping the use of drugs.   High blood pressure causes heart disease and increases the risk of stroke. Blood pressure should be checked at least every 1 to 2 years. Ongoing high blood pressure should be treated with medicines if weight loss and exercise are not effective.   If you are 45 to 67 years old, ask your caregiver if you should take aspirin to prevent heart disease.   Diabetes screening involves taking a blood sample to check your fasting blood sugar level. This should be done once every 3 years, after age 45, if you are within normal weight and without risk factors for diabetes. Testing should be considered at a younger age or be carried out more frequently if you are overweight and have at least 1 risk factor for diabetes.   Colorectal cancer can be detected and often prevented. Most routine colorectal cancer screening begins at the age of 50 and continues through age 75. However, your caregiver may recommend screening at an earlier age if you have risk factors for colon cancer. On a yearly basis, your caregiver may provide home test kits to check for hidden   blood in the stool. Use of a small camera at the end of a tube, to directly examine the colon (sigmoidoscopy or colonoscopy), can detect the earliest forms of colorectal cancer. Talk to your caregiver about this at age 50, when routine screening begins. Direct examination of the colon should be repeated every 5 to 10 years through age 75, unless early forms of pre-cancerous polyps or small growths are found.   Healthy men should no longer receive prostate-specific antigen (PSA) blood tests as part of routine cancer screening. Consult with  your caregiver about prostate cancer screening.   Practice safe sex. Use condoms and avoid high-risk sexual practices to reduce the spread of sexually transmitted infections (STIs).   Use sunscreen with a sun protection factor (SPF) of 30 or greater. Apply sunscreen liberally and repeatedly throughout the day. You should seek shade when your shadow is shorter than you. Protect yourself by wearing long sleeves, pants, a wide-brimmed hat, and sunglasses year round, whenever you are outdoors.   Notify your caregiver of new moles or changes in moles, especially if there is a change in shape or color. Also notify your caregiver if a mole is larger than the size of a pencil eraser.   A one-time screening for abdominal aortic aneurysm (AAA) and surgical repair of large AAAs by sound wave imaging (ultrasonography) is recommended for ages 65 to 75 years who are current or former smokers.   Stay current with your immunizations.  Document Released: 03/11/2008 Document Revised: 05/26/2011 Document Reviewed: 02/08/2011 [employer] Patient Information 2012 [employer]. 

## 2011-10-30 ENCOUNTER — Telehealth: Payer: Self-pay | Admitting: Oncology

## 2011-10-30 NOTE — Telephone Encounter (Signed)
S/w pt today re appts for 2/11 and 8/5.

## 2011-11-08 ENCOUNTER — Other Ambulatory Visit (HOSPITAL_BASED_OUTPATIENT_CLINIC_OR_DEPARTMENT_OTHER): Payer: Medicare Other

## 2011-11-08 DIAGNOSIS — C9110 Chronic lymphocytic leukemia of B-cell type not having achieved remission: Secondary | ICD-10-CM

## 2011-11-08 LAB — CBC WITH DIFFERENTIAL/PLATELET
Basophils Absolute: 0 10*3/uL (ref 0.0–0.1)
Eosinophils Absolute: 0.5 10*3/uL (ref 0.0–0.5)
LYMPH%: 42.1 % (ref 14.0–49.0)
MCV: 93.2 fL (ref 79.3–98.0)
MONO%: 5.7 % (ref 0.0–14.0)
NEUT#: 4.2 10*3/uL (ref 1.5–6.5)
Platelets: 227 10*3/uL (ref 140–400)
RBC: 4.84 10*6/uL (ref 4.20–5.82)

## 2012-05-01 ENCOUNTER — Other Ambulatory Visit (HOSPITAL_BASED_OUTPATIENT_CLINIC_OR_DEPARTMENT_OTHER): Payer: Medicare Other

## 2012-05-01 ENCOUNTER — Encounter: Payer: Self-pay | Admitting: Oncology

## 2012-05-01 ENCOUNTER — Ambulatory Visit (HOSPITAL_BASED_OUTPATIENT_CLINIC_OR_DEPARTMENT_OTHER): Payer: Medicare Other | Admitting: Oncology

## 2012-05-01 ENCOUNTER — Telehealth: Payer: Self-pay | Admitting: Oncology

## 2012-05-01 VITALS — BP 101/68 | HR 56 | Temp 97.0°F | Resp 18 | Ht 71.0 in | Wt 173.1 lb

## 2012-05-01 DIAGNOSIS — D72829 Elevated white blood cell count, unspecified: Secondary | ICD-10-CM

## 2012-05-01 DIAGNOSIS — C9110 Chronic lymphocytic leukemia of B-cell type not having achieved remission: Secondary | ICD-10-CM

## 2012-05-01 LAB — COMPREHENSIVE METABOLIC PANEL
ALT: 21 U/L (ref 0–53)
AST: 27 U/L (ref 0–37)
Alkaline Phosphatase: 49 U/L (ref 39–117)
BUN: 21 mg/dL (ref 6–23)
Calcium: 9.5 mg/dL (ref 8.4–10.5)
Creatinine, Ser: 1.22 mg/dL (ref 0.50–1.35)
Total Bilirubin: 0.8 mg/dL (ref 0.3–1.2)

## 2012-05-01 LAB — CBC WITH DIFFERENTIAL/PLATELET
BASO%: 0.1 % (ref 0.0–2.0)
EOS%: 1.8 % (ref 0.0–7.0)
LYMPH%: 43.4 % (ref 14.0–49.0)
MCH: 32 pg (ref 27.2–33.4)
MCHC: 34.1 g/dL (ref 32.0–36.0)
MCV: 93.9 fL (ref 79.3–98.0)
MONO%: 6.8 % (ref 0.0–14.0)
Platelets: 200 10*3/uL (ref 140–400)
RBC: 4.64 10*6/uL (ref 4.20–5.82)
WBC: 8.8 10*3/uL (ref 4.0–10.3)

## 2012-05-01 NOTE — Progress Notes (Signed)
This office note has been dictated.  #[id number]

## 2012-05-01 NOTE — Progress Notes (Signed)
CC:   Sanda Linger, MD  PROBLEM LIST: 1. Stage 0 chronic lymphocytic leukemia first noted in October 2009     with diagnostic flow cytometry carried out on 08/06/2008. 2. Mild renal insufficiency. 3. Hypertension. 4. Dyslipidemia.  MEDICATIONS: 1. Lotrel 5/20 one capsule daily. 2. Aspirin EC 81 mg daily. 3. Lipitor 10 mg daily.  HISTORY:  I saw Larry Huang for followup of his mild lymphocytosis that has the characteristic findings of chronic lymphocytic leukemia on flow cytometry of the peripheral blood on 08/06/2008.  Larry Huang was last seen by [place] on 05/07/2011.  There have been no changes in his condition.  He remains active, feeling well although he admits that he does not have quite as much energy as he used to.  There have been no health issues for him, specifically no hospital admissions or surgery.  He is without symptoms related to his underlying chronic lymphocytic leukemia.  PHYSICAL EXAMINATION:  Weight today is 173.1 pounds.  Height 5 feet 11 inches.  Body surface area 1.98 sq2.  Blood pressure 101/68.  Other vital signs are normal.  There is no scleral icterus.  Mouth and pharynx are benign.  No peripheral adenopathy palpable.  Heart and lungs are normal.  Abdomen is benign with no organomegaly or masses palpable. Extremities:  No peripheral edema or clubbing.  Neurologic exam is normal.  Skin is tanned without petechiae or purpura.  The patient looks to be in very good shape, fit and healthy.  LABORATORY DATA:  Today, white count 8.8, ANC 4.2, hemoglobin 14.9, hematocrit 43.6, platelets 200,000.  Chemistries today are pending. Chemistries from 07/19/2011 and from 05/07/2011 were normal.  IMAGING STUDIES:  Chest x-ray, 2 view, from 11/03/2009 was negative.  IMPRESSION AND PLAN:  Larry Huang remains with asymptomatic stage 0 chronic lymphocytic leukemia.  His CBC is virtually normal and his prognosis is excellent.  The patient had a flu shot last fall.  I have urged  him to continue with yearly flu shots in the fall.  The patient does not think he has had Pneumovax.  I offered to give him Pneumovax today.  He will talk this over with his wife.  I recommended that he go ahead with Pneumovax.  He can obtain this through his primary physician, Dr. Sanda Linger.  We will plan to see Larry Huang again in 1 year, at which time we will check CBC and chemistries.    ______________________________ Samul Dada, M.D. DSM/MEDQ  D:  05/01/2012  T:  05/01/2012  Job:  [id number]

## 2012-05-01 NOTE — Telephone Encounter (Signed)
appts made and printed for pt aom °

## 2012-07-15 ENCOUNTER — Other Ambulatory Visit: Payer: Self-pay | Admitting: Internal Medicine

## 2012-07-17 ENCOUNTER — Other Ambulatory Visit: Payer: Self-pay | Admitting: Internal Medicine

## 2012-07-19 ENCOUNTER — Ambulatory Visit (INDEPENDENT_AMBULATORY_CARE_PROVIDER_SITE_OTHER): Payer: Medicare Other

## 2012-07-19 DIAGNOSIS — Z23 Encounter for immunization: Secondary | ICD-10-CM

## 2012-10-03 ENCOUNTER — Other Ambulatory Visit (INDEPENDENT_AMBULATORY_CARE_PROVIDER_SITE_OTHER): Payer: Medicare PPO

## 2012-10-03 ENCOUNTER — Ambulatory Visit (INDEPENDENT_AMBULATORY_CARE_PROVIDER_SITE_OTHER): Payer: Medicare PPO | Admitting: Internal Medicine

## 2012-10-03 ENCOUNTER — Encounter: Payer: Self-pay | Admitting: Internal Medicine

## 2012-10-03 VITALS — BP 118/68 | HR 52 | Temp 98.7°F | Resp 16 | Wt 177.0 lb

## 2012-10-03 DIAGNOSIS — E785 Hyperlipidemia, unspecified: Secondary | ICD-10-CM

## 2012-10-03 DIAGNOSIS — N138 Other obstructive and reflux uropathy: Secondary | ICD-10-CM

## 2012-10-03 DIAGNOSIS — I10 Essential (primary) hypertension: Secondary | ICD-10-CM

## 2012-10-03 DIAGNOSIS — Z136 Encounter for screening for cardiovascular disorders: Secondary | ICD-10-CM

## 2012-10-03 DIAGNOSIS — N401 Enlarged prostate with lower urinary tract symptoms: Secondary | ICD-10-CM

## 2012-10-03 DIAGNOSIS — C9110 Chronic lymphocytic leukemia of B-cell type not having achieved remission: Secondary | ICD-10-CM

## 2012-10-03 DIAGNOSIS — Z0000 Encounter for general adult medical examination without abnormal findings: Secondary | ICD-10-CM

## 2012-10-03 LAB — LIPID PANEL
Cholesterol: 189 mg/dL (ref 0–200)
HDL: 36.7 mg/dL — ABNORMAL LOW (ref >39.00)
LDL Cholesterol: 122 mg/dL — ABNORMAL HIGH (ref 0–99)
Triglycerides: 154 mg/dL — ABNORMAL HIGH (ref 0.0–149.0)
VLDL: 30.8 mg/dL (ref 0.0–40.0)

## 2012-10-03 LAB — CBC WITH DIFFERENTIAL/PLATELET
Basophils Absolute: 0 10*3/uL (ref 0.0–0.1)
Basophils Relative: 0.3 % (ref 0.0–3.0)
Eosinophils Absolute: 0.3 10*3/uL (ref 0.0–0.7)
Hemoglobin: 16.3 g/dL (ref 13.0–17.0)
MCHC: 33.4 g/dL (ref 30.0–36.0)
MCV: 92.9 fl (ref 78.0–100.0)
Monocytes Absolute: 0.7 10*3/uL (ref 0.1–1.0)
Neutro Abs: 4.1 10*3/uL (ref 1.4–7.7)
RBC: 5.24 Mil/uL (ref 4.22–5.81)
RDW: 13.9 % (ref 11.5–14.6)

## 2012-10-03 LAB — URINALYSIS, ROUTINE W REFLEX MICROSCOPIC
Bilirubin Urine: NEGATIVE
Hgb urine dipstick: NEGATIVE
Ketones, ur: NEGATIVE
Total Protein, Urine: NEGATIVE
Urine Glucose: NEGATIVE

## 2012-10-03 LAB — COMPREHENSIVE METABOLIC PANEL
AST: 27 U/L (ref 0–37)
Alkaline Phosphatase: 52 U/L (ref 39–117)
BUN: 20 mg/dL (ref 6–23)
Creatinine, Ser: 1.1 mg/dL (ref 0.4–1.5)
Total Bilirubin: 1.4 mg/dL — ABNORMAL HIGH (ref 0.3–1.2)

## 2012-10-03 NOTE — Patient Instructions (Signed)
Health Maintenance, Males A healthy lifestyle and preventative care can promote health and wellness.  Maintain regular health, dental, and eye exams.  Eat a healthy diet. Foods like vegetables, fruits, whole grains, low-fat dairy products, and lean protein foods contain the nutrients you need without too many calories. Decrease your intake of foods high in solid fats, added sugars, and salt. Get information about a proper diet from your caregiver, if necessary.  Regular physical exercise is one of the most important things you can do for your health. Most adults should get at least 150 minutes of moderate-intensity exercise (any activity that increases your heart rate and causes you to sweat) each week. In addition, most adults need muscle-strengthening exercises on 2 or more days a week.   Maintain a healthy weight. The body mass index (BMI) is a screening tool to identify possible weight problems. It provides an estimate of body fat based on height and weight. Your caregiver can help determine your BMI, and can help you achieve or maintain a healthy weight. For adults 20 years and older:  A BMI below 18.5 is considered underweight.  A BMI of 18.5 to 24.9 is normal.  A BMI of 25 to 29.9 is considered overweight.  A BMI of 30 and above is considered obese.  Maintain normal blood lipids and cholesterol by exercising and minimizing your intake of saturated fat. Eat a balanced diet with plenty of fruits and vegetables. Blood tests for lipids and cholesterol should begin at age 20 and be repeated every 5 years. If your lipid or cholesterol levels are high, you are over 50, or you are a high risk for heart disease, you may need your cholesterol levels checked more frequently.Ongoing high lipid and cholesterol levels should be treated with medicines, if diet and exercise are not effective.  If you smoke, find out from your caregiver how to quit. If you do not use tobacco, do not start.  If you  choose to drink alcohol, do not exceed 2 drinks per day. One drink is considered to be 12 ounces (355 mL) of beer, 5 ounces (148 mL) of wine, or 1.5 ounces (44 mL) of liquor.  Avoid use of street drugs. Do not share needles with anyone. Ask for help if you need support or instructions about stopping the use of drugs.  High blood pressure causes heart disease and increases the risk of stroke. Blood pressure should be checked at least every 1 to 2 years. Ongoing high blood pressure should be treated with medicines if weight loss and exercise are not effective.  If you are 45 to 69 years old, ask your caregiver if you should take aspirin to prevent heart disease.  Diabetes screening involves taking a blood sample to check your fasting blood sugar level. This should be done once every 3 years, after age 45, if you are within normal weight and without risk factors for diabetes. Testing should be considered at a younger age or be carried out more frequently if you are overweight and have at least 1 risk factor for diabetes.  Colorectal cancer can be detected and often prevented. Most routine colorectal cancer screening begins at the age of 50 and continues through age 75. However, your caregiver may recommend screening at an earlier age if you have risk factors for colon cancer. On a yearly basis, your caregiver may provide home test kits to check for hidden blood in the stool. Use of a small camera at the end of a tube,   to directly examine the colon (sigmoidoscopy or colonoscopy), can detect the earliest forms of colorectal cancer. Talk to your caregiver about this at age 50, when routine screening begins. Direct examination of the colon should be repeated every 5 to 10 years through age 75, unless early forms of pre-cancerous polyps or small growths are found.  Hepatitis C blood testing is recommended for all people born from 1945 through 1965 and any individual with known risks for hepatitis C.  Healthy  men should no longer receive prostate-specific antigen (PSA) blood tests as part of routine cancer screening. Consult with your caregiver about prostate cancer screening.  Testicular cancer screening is not recommended for adolescents or adult males who have no symptoms. Screening includes self-exam, caregiver exam, and other screening tests. Consult with your caregiver about any symptoms you have or any concerns you have about testicular cancer.  Practice safe sex. Use condoms and avoid high-risk sexual practices to reduce the spread of sexually transmitted infections (STIs).  Use sunscreen with a sun protection factor (SPF) of 30 or greater. Apply sunscreen liberally and repeatedly throughout the day. You should seek shade when your shadow is shorter than you. Protect yourself by wearing long sleeves, pants, a wide-brimmed hat, and sunglasses year round, whenever you are outdoors.  Notify your caregiver of new moles or changes in moles, especially if there is a change in shape or color. Also notify your caregiver if a mole is larger than the size of a pencil eraser.  A one-time screening for abdominal aortic aneurysm (AAA) and surgical repair of large AAAs by sound wave imaging (ultrasonography) is recommended for ages 65 to 75 years who are current or former smokers.  Stay current with your immunizations. Document Released: 03/11/2008 Document Revised: 12/06/2011 Document Reviewed: 02/08/2011 [employer] Patient Information 2013 [employer].  

## 2012-10-04 ENCOUNTER — Encounter: Payer: Self-pay | Admitting: Internal Medicine

## 2012-10-04 NOTE — Assessment & Plan Note (Addendum)

## 2012-10-04 NOTE — Assessment & Plan Note (Signed)
His BP is well controlled Today I will check his lytes and renal function 

## 2012-10-04 NOTE — Assessment & Plan Note (Signed)
CBC today.  

## 2012-10-04 NOTE — Assessment & Plan Note (Signed)
His EKG is normal

## 2012-10-04 NOTE — Assessment & Plan Note (Signed)
Based on the exam and symptoms he is doing well I will check his PSA today

## 2012-10-04 NOTE — Assessment & Plan Note (Signed)
He has muscle aches and his CK is a little high so I have asked him to stop lipitor for now

## 2012-10-04 NOTE — Progress Notes (Signed)
Subjective:    Patient ID: Larry Huang, male    DOB: [birth year], 67 y.o.   MRN: [id number]  Hypertension This is a chronic problem. The current episode started more than 1 year ago. The problem has been gradually improving since onset. The problem is controlled. Pertinent negatives include no anxiety, blurred vision, chest pain, headaches, malaise/fatigue, neck pain, orthopnea, palpitations, peripheral edema, PND, shortness of breath or sweats. Past treatments include ACE inhibitors and calcium channel blockers. The current treatment provides significant improvement. There are no compliance problems.  There is no history of chronic renal disease.  Hyperlipidemia This is a chronic problem. The current episode started more than 1 year ago. The problem is controlled. Recent lipid tests were reviewed and are normal. He has no history of chronic renal disease, diabetes, hypothyroidism, liver disease, obesity or nephrotic syndrome. There are no known factors aggravating his hyperlipidemia. Associated symptoms include myalgias. Pertinent negatives include no chest pain, focal sensory loss, focal weakness, leg pain or shortness of breath. Current antihyperlipidemic treatment includes statins, diet change and exercise. The current treatment provides significant improvement of lipids. Compliance problems include medication side effects.       Review of Systems  Constitutional: Negative for fever, chills, malaise/fatigue, diaphoresis, activity change, appetite change, fatigue and unexpected weight change.  HENT: Negative.  Negative for neck pain.   Eyes: Negative.  Negative for blurred vision.  Respiratory: Negative for apnea, cough, choking, chest tightness, shortness of breath, wheezing and stridor.   Cardiovascular: Negative for chest pain, palpitations, orthopnea, leg swelling and PND.  Gastrointestinal: Negative.  Negative for nausea, vomiting, abdominal pain, diarrhea, constipation and blood in  stool.  Genitourinary: Negative.  Negative for dysuria, frequency, scrotal swelling, difficulty urinating and testicular pain.  Musculoskeletal: Positive for myalgias. Negative for back pain, joint swelling, arthralgias and gait problem.  Skin: Negative.   Neurological: Negative.  Negative for dizziness, focal weakness, syncope, facial asymmetry, speech difficulty and headaches.  Hematological: Negative for adenopathy. Does not bruise/bleed easily.  Psychiatric/Behavioral: Negative.        Objective:   Physical Exam  Vitals reviewed. Constitutional: He is oriented to person, place, and time. He appears well-developed and well-nourished. No distress.  HENT:  Head: Normocephalic and atraumatic.  Mouth/Throat: Oropharynx is clear and moist. No oropharyngeal exudate.  Eyes: Conjunctivae normal are normal. Right eye exhibits no discharge. Left eye exhibits no discharge. No scleral icterus.  Neck: Normal range of motion. Neck supple. No JVD present. No tracheal deviation present. No thyromegaly present.  Cardiovascular: Normal rate, regular rhythm, normal heart sounds and intact distal pulses.  Exam reveals no gallop and no friction rub.   No murmur heard. Pulmonary/Chest: Effort normal and breath sounds normal. No stridor. No respiratory distress. He has no wheezes. He has no rales. He exhibits no tenderness.  Abdominal: Soft. Bowel sounds are normal. He exhibits no distension and no mass. There is no tenderness. There is no rebound and no guarding. Hernia confirmed negative in the right inguinal area and confirmed negative in the left inguinal area.  Genitourinary: Rectum normal, prostate normal and penis normal. Rectal exam shows no external hemorrhoid, no internal hemorrhoid, no fissure, no mass, no tenderness and anal tone normal. Guaiac negative stool. Prostate is not enlarged and not tender. Right testis shows no mass, no swelling and no tenderness. Right testis is descended. Left testis  shows no mass, no swelling and no tenderness. Left testis is descended. Circumcised. No penile erythema or penile tenderness. No discharge  found.  Musculoskeletal: Normal range of motion. He exhibits no edema and no tenderness.  Lymphadenopathy:    He has no cervical adenopathy.       Right: No inguinal adenopathy present.       Left: No inguinal adenopathy present.  Neurological: He is oriented to person, place, and time.  Skin: Skin is warm and dry. No rash noted. He is not diaphoretic. No erythema. No pallor.  Psychiatric: He has a normal mood and affect. His behavior is normal. Judgment and thought content normal.     Lab Results  Component Value Date   WBC 10.0 10/03/2012   HGB 16.3 10/03/2012   HCT 48.7 10/03/2012   PLT 222.0 10/03/2012   GLUCOSE 82 10/03/2012   CHOL 189 10/03/2012   TRIG 154.0* 10/03/2012   HDL 36.70* 10/03/2012   LDLCALC 122* 10/03/2012   ALT 23 10/03/2012   AST 27 10/03/2012   NA 137 10/03/2012   K 4.8 10/03/2012   CL 104 10/03/2012   CREATININE 1.1 10/03/2012   BUN 20 10/03/2012   CO2 28 10/03/2012   TSH 1.35 10/03/2012   PSA 0.60 10/03/2012       Assessment & Plan:

## 2012-11-22 ENCOUNTER — Telehealth: Payer: Self-pay

## 2012-11-22 MED ORDER — ZOSTER VACCINE LIVE 19400 UNT/0.65ML ~~LOC~~ SOLR: 0.6500 mL | Freq: Once | SUBCUTANEOUS | Status: DC

## 2012-11-22 NOTE — Telephone Encounter (Signed)
Ok, please write this up

## 2012-11-22 NOTE — Telephone Encounter (Signed)
Pt called stating he was advised by [facility] that he should proceed with shingles vaccine. Per [employer], it is cheaper for him to have immunization administered at pharmacy. Pt is requesting an Rx to [facility] on [address]

## 2013-03-29 ENCOUNTER — Ambulatory Visit (INDEPENDENT_AMBULATORY_CARE_PROVIDER_SITE_OTHER): Payer: Medicare PPO | Admitting: Internal Medicine

## 2013-03-29 ENCOUNTER — Encounter: Payer: Self-pay | Admitting: Internal Medicine

## 2013-03-29 VITALS — BP 120/82 | HR 69 | Temp 97.6°F | Resp 16 | Wt 172.5 lb

## 2013-03-29 DIAGNOSIS — J209 Acute bronchitis, unspecified: Secondary | ICD-10-CM

## 2013-03-29 MED ORDER — AMOXICILLIN 875 MG PO TABS: 875.0000 mg | ORAL_TABLET | Freq: Two times a day (BID) | ORAL | Status: DC

## 2013-03-29 NOTE — Progress Notes (Signed)
Subjective:    Patient ID: Larry Huang, male    DOB: Sep 15, 1946, 67 y.o.   MRN: [id number]  URI  This is a new problem. The current episode started in the past 7 days. The problem has been unchanged. The maximum temperature recorded prior to his arrival was 100 - 100.9 F. The fever has been present for 3 to 4 days. Associated symptoms include coughing and a sore throat. Pertinent negatives include no abdominal pain, chest pain, congestion, diarrhea, dysuria, ear pain, headaches, joint pain, joint swelling, nausea, neck pain, plugged ear sensation, rash, rhinorrhea, sinus pain, sneezing, swollen glands, vomiting or wheezing. He has tried NSAIDs for the symptoms. The treatment provided moderate relief.      Review of Systems  Constitutional: Positive for fever and chills. Negative for diaphoresis, activity change, appetite change, fatigue and unexpected weight change.  HENT: Positive for sore throat. Negative for ear pain, congestion, rhinorrhea, sneezing, drooling, mouth sores, trouble swallowing, neck pain, dental problem, voice change and sinus pressure.   Eyes: Negative.   Respiratory: Positive for cough. Negative for apnea, choking, chest tightness, shortness of breath, wheezing and stridor.   Cardiovascular: Negative.  Negative for chest pain, palpitations and leg swelling.  Gastrointestinal: Negative.  Negative for nausea, vomiting, abdominal pain and diarrhea.  Endocrine: Negative.   Genitourinary: Negative.  Negative for dysuria.  Musculoskeletal: Negative.  Negative for myalgias, back pain, joint pain, joint swelling, arthralgias and gait problem.  Skin: Negative.  Negative for rash.  Allergic/Immunologic: Negative.   Neurological: Negative.  Negative for headaches.  Hematological: Negative.  Negative for adenopathy. Does not bruise/bleed easily.  Psychiatric/Behavioral: Negative.        Objective:   Physical Exam  Vitals reviewed. Constitutional: He is oriented to person,  place, and time. He appears well-developed and well-nourished.  Non-toxic appearance. He does not have a sickly appearance. He does not appear ill. No distress.  HENT:  Head: Normocephalic and atraumatic. No trismus in the jaw.  Right Ear: Hearing, tympanic membrane, external ear and ear canal normal.  Left Ear: Hearing, tympanic membrane, external ear and ear canal normal.  Nose: No mucosal edema. Right sinus exhibits no maxillary sinus tenderness and no frontal sinus tenderness. Left sinus exhibits no maxillary sinus tenderness.  Mouth/Throat: Mucous membranes are normal. Mucous membranes are not pale, not dry and not cyanotic. No oral lesions. No edematous. Posterior oropharyngeal erythema present. No oropharyngeal exudate, posterior oropharyngeal edema or tonsillar abscesses.  Eyes: Conjunctivae are normal. Right eye exhibits no discharge. Left eye exhibits no discharge. No scleral icterus.  Neck: Normal range of motion. Neck supple. No JVD present. No tracheal deviation present. No thyromegaly present.  Cardiovascular: Normal rate, regular rhythm, normal heart sounds and intact distal pulses.  Exam reveals no gallop and no friction rub.   No murmur heard. Pulmonary/Chest: Effort normal and breath sounds normal. No stridor. No respiratory distress. He has no wheezes. He has no rales. He exhibits no tenderness.  Abdominal: Soft. Bowel sounds are normal. He exhibits no distension and no mass. There is no tenderness. There is no rebound and no guarding.  Musculoskeletal: Normal range of motion. He exhibits no edema and no tenderness.  Lymphadenopathy:    He has no cervical adenopathy.  Neurological: He is oriented to person, place, and time.  Skin: Skin is warm and dry. No rash noted. No erythema. No pallor.  Psychiatric: He has a normal mood and affect. His behavior is normal. Judgment and thought content normal.  Lab Results  Component Value Date   WBC 10.0 10/03/2012   HGB 16.3 10/03/2012    HCT 48.7 10/03/2012   PLT 222.0 10/03/2012   GLUCOSE 82 10/03/2012   CHOL 189 10/03/2012   TRIG 154.0* 10/03/2012   HDL 36.70* 10/03/2012   LDLCALC 122* 10/03/2012   ALT 23 10/03/2012   AST 27 10/03/2012   NA 137 10/03/2012   K 4.8 10/03/2012   CL 104 10/03/2012   CREATININE 1.1 10/03/2012   BUN 20 10/03/2012   CO2 28 10/03/2012   TSH 1.35 10/03/2012   PSA 0.60 10/03/2012       Assessment & Plan:

## 2013-03-29 NOTE — Assessment & Plan Note (Signed)
Will treat with amoxil He will continue motrin as needed He does not want a cough suppressant

## 2013-03-29 NOTE — Patient Instructions (Signed)

## 2013-05-01 ENCOUNTER — Other Ambulatory Visit (HOSPITAL_BASED_OUTPATIENT_CLINIC_OR_DEPARTMENT_OTHER): Payer: Medicare PPO | Admitting: Lab

## 2013-05-01 ENCOUNTER — Ambulatory Visit (HOSPITAL_BASED_OUTPATIENT_CLINIC_OR_DEPARTMENT_OTHER): Payer: Medicare PPO | Admitting: Hematology and Oncology

## 2013-05-01 ENCOUNTER — Telehealth: Payer: Self-pay | Admitting: Hematology and Oncology

## 2013-05-01 VITALS — BP 110/64 | HR 55 | Temp 97.5°F | Resp 20 | Ht 71.0 in | Wt 175.8 lb

## 2013-05-01 DIAGNOSIS — C9110 Chronic lymphocytic leukemia of B-cell type not having achieved remission: Secondary | ICD-10-CM

## 2013-05-01 LAB — CBC WITH DIFFERENTIAL/PLATELET
BASO%: 0.2 % (ref 0.0–2.0)
Eosinophils Absolute: 0.3 10*3/uL (ref 0.0–0.5)
HCT: 43.2 % (ref 38.4–49.9)
LYMPH%: 41.5 % (ref 14.0–49.0)
MCHC: 34.1 g/dL (ref 32.0–36.0)
MCV: 94 fL (ref 79.3–98.0)
MONO#: 0.8 10*3/uL (ref 0.1–0.9)
MONO%: 9 % (ref 0.0–14.0)
NEUT%: 46 % (ref 39.0–75.0)
Platelets: 199 10*3/uL (ref 140–400)
RBC: 4.6 10*6/uL (ref 4.20–5.82)
WBC: 9.2 10*3/uL (ref 4.0–10.3)

## 2013-05-01 LAB — COMPREHENSIVE METABOLIC PANEL (CC13)
BUN: 23.7 mg/dL (ref 7.0–26.0)
CO2: 21 mEq/L — ABNORMAL LOW (ref 22–29)
Calcium: 9.1 mg/dL (ref 8.4–10.4)
Chloride: 108 mEq/L (ref 98–109)
Creatinine: 1.4 mg/dL — ABNORMAL HIGH (ref 0.7–1.3)
Total Bilirubin: 0.7 mg/dL (ref 0.20–1.20)

## 2013-05-01 NOTE — Telephone Encounter (Signed)
GV AND PRINTED APPT SCHED AND AVS

## 2013-05-01 NOTE — Progress Notes (Signed)
CC:   Sanda Linger, MD  PROBLEM LIST: 1. Stage 0 chronic lymphocytic leukemia first noted in October 2009     with diagnostic flow cytometry carried out on 08/06/2008. 2. Mild renal insufficiency. 3. Hypertension. 4. Dyslipidemia.  MEDICATIONS: 1. Lotrel 5/20 one capsule daily. 2. Aspirin EC 81 mg daily. 3. Lipitor 10 mg daily.  HISTORY:  I saw Bernon Viviano for followup of his mild lymphocytosis that has the characteristic findings of chronic lymphocytic leukemia on flow cytometry of the peripheral blood on 08/06/2008.  There have been no changes in his condition.  He remains active, feeling well although he admits that he does not have quite as much energy as he used to.  There have been no health issues for him, specifically no hospital admissions or surgery.  He is without symptoms related to his underlying chronic lymphocytic leukemia.  Blood pressure 110/64, pulse 55, temperature 97.5 F (36.4 C), temperature source Oral, resp. rate 20, height 5\' 11"  (1.803 m), weight 175 lb 12.8 oz (79.742 kg). PHYSICAL EXAMINATION:   There is no scleral icterus.  Mouth and pharynx are benign.  No peripheral adenopathy palpable.  Heart and lungs are normal.  Abdomen is benign with no organomegaly or masses palpable. Extremities:  No peripheral edema or clubbing.  Neurologic exam is normal.  Skin is tanned without petechiae or purpura.  The patient looks to be in very good shape, fit and healthy.  LABORATORY DATA:   CBC    Component Value Date/Time   WBC 9.2 05/01/2013 1523   WBC 10.0 10/03/2012 0930   RBC 4.60 05/01/2013 1523   RBC 5.24 10/03/2012 0930   HGB 14.7 05/01/2013 1523   HGB 16.3 10/03/2012 0930   HCT 43.2 05/01/2013 1523   HCT 48.7 10/03/2012 0930   PLT 199 05/01/2013 1523   PLT 222.0 10/03/2012 0930   MCV 94.0 05/01/2013 1523   MCV 92.9 10/03/2012 0930   MCH 32.1 05/01/2013 1523   MCHC 34.1 05/01/2013 1523   MCHC 33.4 10/03/2012 0930   RDW 13.4 05/01/2013 1523   RDW 13.9 10/03/2012 0930   LYMPHSABS 3.8* 05/01/2013 1523   LYMPHSABS 4.8* 10/03/2012 0930   MONOABS 0.8 05/01/2013 1523   MONOABS 0.7 10/03/2012 0930   EOSABS 0.3 05/01/2013 1523   EOSABS 0.3 10/03/2012 0930   BASOSABS 0.0 05/01/2013 1523   BASOSABS 0.0 10/03/2012 0930    Lab Results  Component Value Date   GLUCOSE 106 05/01/2013   BUN 23.7 05/01/2013   CO2 21* 05/01/2013   ALT 26 05/01/2013   AST 26 05/01/2013   LDH 244 05/01/2013   K 4.5 05/01/2013   CREATININE 1.4* 05/01/2013    IMPRESSION AND PLAN:  Mr. Bakos remains with asymptomatic stage 0 chronic lymphocytic leukemia.  His CBC is virtually normal and his prognosis is excellent.   We will plan to see Mr. Kenley again in 4-6 moths, at which time we will check CBC and chemistries.

## 2013-06-29 ENCOUNTER — Encounter: Payer: Self-pay | Admitting: Internal Medicine

## 2013-06-29 ENCOUNTER — Ambulatory Visit (INDEPENDENT_AMBULATORY_CARE_PROVIDER_SITE_OTHER): Payer: Medicare PPO | Admitting: Internal Medicine

## 2013-06-29 VITALS — BP 130/80 | HR 51 | Temp 97.4°F | Resp 16 | Wt 176.0 lb

## 2013-06-29 DIAGNOSIS — K219 Gastro-esophageal reflux disease without esophagitis: Secondary | ICD-10-CM

## 2013-06-29 DIAGNOSIS — J040 Acute laryngitis: Secondary | ICD-10-CM

## 2013-06-29 DIAGNOSIS — Z23 Encounter for immunization: Secondary | ICD-10-CM

## 2013-06-29 MED ORDER — ESOMEPRAZOLE MAGNESIUM 40 MG PO CPDR: 40.0000 mg | DELAYED_RELEASE_CAPSULE | Freq: Every day | ORAL | Status: DC

## 2013-06-29 MED ORDER — METHYLPREDNISOLONE 4 MG PO KIT: PACK | ORAL | Status: DC

## 2013-06-29 NOTE — Progress Notes (Signed)
Subjective:    Patient ID: Larry Huang, male    DOB: [birth year], 67 y.o.   MRN: [id number]  Gastrophageal Reflux He complains of a hoarse voice. He reports no abdominal pain, no belching, no chest pain, no choking, no coughing, no dysphagia, no early satiety, no globus sensation, no heartburn, no nausea, no sore throat, no stridor, no tooth decay, no water brash or no wheezing. This is a new problem. The current episode started more than 1 month ago. The problem has been unchanged. Nothing aggravates the symptoms. Pertinent negatives include no anemia, fatigue, melena, muscle weakness, orthopnea or weight loss. He has tried a histamine-2 antagonist for the symptoms. The treatment provided moderate relief.      Review of Systems  Constitutional: Negative.  Negative for fever, chills, weight loss, diaphoresis, activity change, appetite change, fatigue and unexpected weight change.  HENT: Positive for hoarse voice and voice change (laryngitis for one month). Negative for nosebleeds, congestion, sore throat, facial swelling, rhinorrhea, sneezing, mouth sores, trouble swallowing, dental problem, postnasal drip and sinus pressure.   Eyes: Negative.   Respiratory: Negative.  Negative for cough, choking, shortness of breath, wheezing and stridor.   Cardiovascular: Negative.  Negative for chest pain, palpitations and leg swelling.  Gastrointestinal: Negative.  Negative for heartburn, dysphagia, nausea, abdominal pain and melena.  Endocrine: Negative.   Genitourinary: Negative.   Musculoskeletal: Negative.  Negative for myalgias, back pain, joint swelling, arthralgias, gait problem and muscle weakness.  Skin: Negative.   Allergic/Immunologic: Negative.   Neurological: Negative.   Hematological: Negative.  Negative for adenopathy. Does not bruise/bleed easily.  Psychiatric/Behavioral: Negative.        Objective:   Physical Exam  Vitals reviewed. Constitutional: He is oriented to person, place,  and time. He appears well-developed and well-nourished.  Non-toxic appearance. He does not have a sickly appearance. He does not appear ill. No distress.  He has a mildly raspy voice but no resp distress  HENT:  Head: Normocephalic and atraumatic.  Nose: No mucosal edema or rhinorrhea. Right sinus exhibits no maxillary sinus tenderness and no frontal sinus tenderness. Left sinus exhibits no maxillary sinus tenderness and no frontal sinus tenderness.  Mouth/Throat: Oropharynx is clear and moist and mucous membranes are normal. Mucous membranes are not pale, not dry and not cyanotic. No oral lesions. No trismus in the jaw. No edematous. No oropharyngeal exudate, posterior oropharyngeal edema, posterior oropharyngeal erythema or tonsillar abscesses.  Eyes: Conjunctivae are normal. Right eye exhibits no discharge. Left eye exhibits no discharge. No scleral icterus.  Neck: Trachea normal and normal range of motion. Neck supple. No JVD present. No tracheal deviation and no edema present. No mass and no thyromegaly present.  Cardiovascular: Normal rate, regular rhythm, normal heart sounds and intact distal pulses.  Exam reveals no gallop and no friction rub.   No murmur heard. Pulmonary/Chest: Effort normal and breath sounds normal. No stridor. No respiratory distress. He has no wheezes. He has no rales. He exhibits no tenderness.  Abdominal: Soft. Bowel sounds are normal. He exhibits no distension and no mass. There is no tenderness. There is no rebound and no guarding.  Musculoskeletal: Normal range of motion. He exhibits no edema and no tenderness.  Lymphadenopathy:    He has no cervical adenopathy.  Neurological: He is oriented to person, place, and time.  Skin: Skin is warm and dry. No rash noted. He is not diaphoretic. No erythema. No pallor.     Lab Results  Component Value Date  WBC 9.2 05/01/2013   HGB 14.7 05/01/2013   HCT 43.2 05/01/2013   PLT 199 05/01/2013   GLUCOSE 106 05/01/2013   CHOL 189  10/03/2012   TRIG 154.0* 10/03/2012   HDL 36.70* 10/03/2012   LDLCALC 122* 10/03/2012   ALT 26 05/01/2013   AST 26 05/01/2013   NA 138 05/01/2013   K 4.5 05/01/2013   CL 104 10/03/2012   CREATININE 1.4* 05/01/2013   BUN 23.7 05/01/2013   CO2 21* 05/01/2013   TSH 1.35 10/03/2012   PSA 0.60 10/03/2012       Assessment & Plan:

## 2013-06-29 NOTE — Patient Instructions (Signed)
Laryngitis At the top of your windpipe is your voice box. It is the source of your voice. Inside your voice box are 2 bands of muscles called vocal cords. When you breathe, your vocal cords are relaxed and open so that air can get into the lungs. When you decide to say something, these cords come together and vibrate. The sound from these vibrations goes into your throat and comes out through your mouth as sound. Laryngitis is an inflammation of the vocal cords that causes hoarseness, cough, loss of voice, sore throat, and dry throat. Laryngitis can be temporary (acute) or long-term (chronic). Most cases of acute laryngitis improve with time.Chronic laryngitis lasts for more than 3 weeks. CAUSES Laryngitis can often be related to excessive smoking, talking, or yelling, as well as inhalation of toxic fumes and allergies. Acute laryngitis is usually caused by a viral infection, vocal strain, measles or mumps, or bacterial infections. Chronic laryngitis is usually caused by vocal cord strain, vocal cord injury, postnasal drip, growths on the vocal cords, or acid reflux. SYMPTOMS   Cough.  Sore throat.  Dry throat. RISK FACTORS  Respiratory infections.  Exposure to irritating substances, such as cigarette smoke, excessive amounts of alcohol, stomach acids, and workplace chemicals.  Voice trauma, such as vocal cord injury from shouting or speaking too loud. DIAGNOSIS  Your cargiver will perform a physical exam. During the physical exam, your caregiver will examine your throat. The most common sign of laryngitis is hoarseness. Laryngoscopy may be necessary to confirm the diagnosis of this condition. This procedure allows your caregiver to look into the larynx. HOME CARE INSTRUCTIONS  Drink enough fluids to keep your urine clear or pale yellow.  Rest until you no longer have symptoms or as directed by your caregiver.  Breathe in moist air.  Take all medicine as directed by your  caregiver.  Do not smoke.  Talk as little as possible (this includes whispering).  Write on paper instead of talking until your voice is back to normal.  Follow up with your caregiver if your condition has not improved after 10 days. SEEK MEDICAL CARE IF:   You have trouble breathing.  You cough up blood.  You have persistent fever.  You have increasing pain.  You have difficulty swallowing. MAKE SURE YOU:  Understand these instructions.  Will watch your condition.  Will get help right away if you are not doing well or get worse. Document Released: 09/13/2005 Document Revised: 12/06/2011 Document Reviewed: 11/19/2010 [employer] Patient Information 2014 [employer].  

## 2013-06-29 NOTE — Assessment & Plan Note (Signed)
Start nexium 

## 2013-06-29 NOTE — Assessment & Plan Note (Signed)
This started after a recent URI so I am concerned that he may have some laryngeal swelling and have therefore asked him to start a medrol dose pak and to rest his voice. I will also treat the GERD with nexium.

## 2013-07-10 ENCOUNTER — Other Ambulatory Visit: Payer: Self-pay | Admitting: Internal Medicine

## 2013-07-23 ENCOUNTER — Ambulatory Visit (INDEPENDENT_AMBULATORY_CARE_PROVIDER_SITE_OTHER): Payer: Medicare PPO | Admitting: Internal Medicine

## 2013-07-23 ENCOUNTER — Encounter: Payer: Self-pay | Admitting: Internal Medicine

## 2013-07-23 VITALS — BP 110/88 | HR 72 | Temp 98.2°F | Resp 16 | Wt 176.0 lb

## 2013-07-23 DIAGNOSIS — K219 Gastro-esophageal reflux disease without esophagitis: Secondary | ICD-10-CM

## 2013-07-24 ENCOUNTER — Encounter: Payer: Self-pay | Admitting: Internal Medicine

## 2013-07-24 NOTE — Assessment & Plan Note (Signed)
He will stay on nexium for now

## 2013-07-24 NOTE — Progress Notes (Signed)
  Subjective:    Patient ID: Larry Huang, male    DOB: 1946/06/17, 67 y.o.   MRN: [id number]  HPI  He returns for f/up and he tells me that the laryngitis has resolved, he feels well today and offers no complaints.  Review of Systems  Constitutional: Negative.  Negative for fever, chills, diaphoresis, appetite change and fatigue.  HENT: Negative.  Negative for sore throat, trouble swallowing and voice change.   Eyes: Negative.   Respiratory: Negative for cough, chest tightness, shortness of breath, wheezing and stridor.   Cardiovascular: Negative.  Negative for chest pain, palpitations and leg swelling.  Gastrointestinal: Negative.  Negative for abdominal pain.  Endocrine: Negative.   Genitourinary: Negative.   Musculoskeletal: Negative.   Skin: Negative.   Allergic/Immunologic: Negative.   Neurological: Negative.  Negative for dizziness, tremors and weakness.  Hematological: Negative.  Negative for adenopathy. Does not bruise/bleed easily.  Psychiatric/Behavioral: Negative.        Objective:   Physical Exam  Vitals reviewed. Constitutional: He is oriented to person, place, and time. He appears well-developed and well-nourished.  Non-toxic appearance. He does not have a sickly appearance. He does not appear ill. No distress.  HENT:  Head: Normocephalic and atraumatic.  Mouth/Throat: Oropharynx is clear and moist. No oropharyngeal exudate.  Eyes: Conjunctivae are normal. Right eye exhibits no discharge. Left eye exhibits no discharge. No scleral icterus.  Neck: Normal range of motion. Neck supple. No JVD present. No tracheal deviation present. No thyromegaly present.  Cardiovascular: Normal rate, regular rhythm, normal heart sounds and intact distal pulses.  Exam reveals no gallop and no friction rub.   No murmur heard. Pulmonary/Chest: Effort normal and breath sounds normal. No stridor. No respiratory distress. He has no wheezes. He has no rales. He exhibits no tenderness.   Abdominal: Soft. Bowel sounds are normal. He exhibits no distension and no mass. There is no tenderness. There is no rebound and no guarding.  Musculoskeletal: Normal range of motion. He exhibits no edema and no tenderness.  Lymphadenopathy:    He has no cervical adenopathy.  Neurological: He is oriented to person, place, and time.  Skin: Skin is warm and dry. No rash noted. He is not diaphoretic. No erythema. No pallor.          Assessment & Plan:

## 2013-08-08 ENCOUNTER — Ambulatory Visit (INDEPENDENT_AMBULATORY_CARE_PROVIDER_SITE_OTHER)
Admission: RE | Admit: 2013-08-08 | Discharge: 2013-08-08 | Disposition: A | Discharge status: Home / Self Care | Payer: Medicare PPO | Source: Ambulatory Visit | Attending: Internal Medicine | Admitting: Internal Medicine

## 2013-08-08 ENCOUNTER — Ambulatory Visit (INDEPENDENT_AMBULATORY_CARE_PROVIDER_SITE_OTHER): Payer: Medicare PPO | Admitting: Internal Medicine

## 2013-08-08 ENCOUNTER — Encounter: Payer: Self-pay | Admitting: Internal Medicine

## 2013-08-08 VITALS — BP 106/72 | HR 57 | Temp 97.7°F | Resp 16 | Ht 71.0 in | Wt 177.0 lb

## 2013-08-08 DIAGNOSIS — R05 Cough: Secondary | ICD-10-CM

## 2013-08-08 DIAGNOSIS — I10 Essential (primary) hypertension: Secondary | ICD-10-CM

## 2013-08-08 DIAGNOSIS — J040 Acute laryngitis: Secondary | ICD-10-CM

## 2013-08-08 MED ORDER — AMLODIPINE BESYLATE 5 MG PO TABS: 5.0000 mg | ORAL_TABLET | Freq: Every day | ORAL | Status: DC

## 2013-08-08 NOTE — Progress Notes (Signed)
Pre visit review using our clinic review tool, if applicable. No additional management support is needed unless otherwise documented below in the visit note. 

## 2013-08-08 NOTE — Assessment & Plan Note (Signed)
Due to the cough and other URI s/s will stop the ACEI He will remain on the CCB His BP is well controlled

## 2013-08-08 NOTE — Patient Instructions (Signed)

## 2013-08-08 NOTE — Assessment & Plan Note (Signed)
This has returned so I have asked him to see ENT

## 2013-08-08 NOTE — Progress Notes (Signed)
Subjective:    Patient ID: Larry Huang, male    DOB: Nov 03, 1945, 67 y.o.   MRN: [id number]  Cough This is a recurrent problem. The current episode started more than 1 month ago. The problem has been unchanged. The problem occurs every few hours. The cough is non-productive. Associated symptoms include postnasal drip and rhinorrhea. Pertinent negatives include no chest pain, chills, ear congestion, ear pain, fever, headaches, heartburn, hemoptysis, myalgias, nasal congestion, rash, sore throat, shortness of breath, sweats, weight loss or wheezing. Nothing aggravates the symptoms. Treatments tried: medrol dose pak. The treatment provided no relief.      Review of Systems  Constitutional: Negative.  Negative for fever, chills and weight loss.  HENT: Positive for postnasal drip, rhinorrhea and voice change (laryngitis has returned). Negative for congestion, dental problem, drooling, ear discharge, ear pain, sore throat, tinnitus and trouble swallowing.   Eyes: Negative.   Respiratory: Positive for cough. Negative for apnea, hemoptysis, choking, chest tightness, shortness of breath, wheezing and stridor.   Cardiovascular: Negative.  Negative for chest pain, palpitations and leg swelling.  Gastrointestinal: Negative.  Negative for heartburn.  Endocrine: Negative.   Genitourinary: Negative.   Musculoskeletal: Negative.  Negative for arthralgias and myalgias.  Skin: Negative.  Negative for color change, pallor, rash and wound.  Allergic/Immunologic: Negative.   Neurological: Negative.  Negative for dizziness, weakness and headaches.  Hematological: Negative.  Negative for adenopathy. Does not bruise/bleed easily.  Psychiatric/Behavioral: Negative.        Objective:   Physical Exam  Vitals reviewed. Constitutional: He is oriented to person, place, and time. He appears well-developed and well-nourished. No distress.  HENT:  Head: Normocephalic and atraumatic.  Mouth/Throat: Oropharynx is  clear and moist and mucous membranes are normal. Mucous membranes are not pale, not dry and not cyanotic. No oral lesions. No trismus in the jaw. No uvula swelling. No oropharyngeal exudate, posterior oropharyngeal edema, posterior oropharyngeal erythema or tonsillar abscesses.  Eyes: Conjunctivae are normal. Right eye exhibits no discharge. Left eye exhibits no discharge. No scleral icterus.  Neck: Normal range of motion. Neck supple. No JVD present. No tracheal tenderness present. No tracheal deviation present. No thyromegaly present.  Cardiovascular: Normal rate, regular rhythm, normal heart sounds and intact distal pulses.  Exam reveals no gallop.   No murmur heard. Pulmonary/Chest: Effort normal and breath sounds normal. No stridor. No respiratory distress. He has no wheezes. He has no rales. He exhibits no tenderness.  Abdominal: Soft. Bowel sounds are normal. He exhibits no distension and no mass. There is no tenderness. There is no rebound and no guarding.  Musculoskeletal: Normal range of motion. He exhibits no edema and no tenderness.  Lymphadenopathy:    He has no cervical adenopathy.  Neurological: He is oriented to person, place, and time.  Skin: Skin is warm and dry. No rash noted. He is not diaphoretic. No erythema. No pallor.  Psychiatric: He has a normal mood and affect. His behavior is normal. Judgment and thought content normal.     Lab Results  Component Value Date   WBC 9.2 05/01/2013   HGB 14.7 05/01/2013   HCT 43.2 05/01/2013   PLT 199 05/01/2013   GLUCOSE 106 05/01/2013   CHOL 189 10/03/2012   TRIG 154.0* 10/03/2012   HDL 36.70* 10/03/2012   LDLCALC 122* 10/03/2012   ALT 26 05/01/2013   AST 26 05/01/2013   NA 138 05/01/2013   K 4.5 05/01/2013   CL 104 10/03/2012   CREATININE 1.4* 05/01/2013  BUN 23.7 05/01/2013   CO2 21* 05/01/2013   TSH 1.35 10/03/2012   PSA 0.60 10/03/2012       Assessment & Plan:

## 2013-08-08 NOTE — Assessment & Plan Note (Signed)
This is most likely caused by the ACEI, will stop it His CXR is normal He does not want a med to treat the symptoms

## 2013-08-09 ENCOUNTER — Encounter: Payer: Self-pay | Admitting: Internal Medicine

## 2013-08-30 ENCOUNTER — Other Ambulatory Visit: Payer: Self-pay | Admitting: Internal Medicine

## 2013-08-30 ENCOUNTER — Encounter (HOSPITAL_BASED_OUTPATIENT_CLINIC_OR_DEPARTMENT_OTHER): Payer: Self-pay | Admitting: *Deleted

## 2013-08-31 ENCOUNTER — Encounter (HOSPITAL_BASED_OUTPATIENT_CLINIC_OR_DEPARTMENT_OTHER): Payer: Self-pay | Admitting: *Deleted

## 2013-08-31 NOTE — Progress Notes (Signed)
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

## 2013-09-03 ENCOUNTER — Encounter (HOSPITAL_BASED_OUTPATIENT_CLINIC_OR_DEPARTMENT_OTHER)
Admission: RE | Admit: 2013-09-03 | Discharge: 2013-09-03 | Disposition: A | Discharge status: Home / Self Care | Payer: Medicare PPO | Source: Ambulatory Visit | Attending: Otolaryngology | Admitting: Otolaryngology

## 2013-09-03 LAB — BASIC METABOLIC PANEL
CO2: 23 mEq/L (ref 19–32)
Chloride: 103 mEq/L (ref 96–112)
Creatinine, Ser: 1.17 mg/dL (ref 0.50–1.35)
GFR calc Af Amer: 72 mL/min — ABNORMAL LOW (ref >90)
Glucose, Bld: 128 mg/dL — ABNORMAL HIGH (ref 70–99)
Potassium: 4.3 mEq/L (ref 3.5–5.1)
Sodium: 137 mEq/L (ref 135–145)

## 2013-09-03 NOTE — H&P (Signed)
PREOPERATIVE H&P  Chief Complaint: hoarseness  HPI: Larry Huang is a 67 y.o. male who presents for evaluation of hoarseness for 3 months without improvement. On exam patient has a large right vocal cord polyp. He's taken to the OR for microlaryngoscopy and removal of the polyp.  Past Medical History  Diagnosis Date  . Hyperlipidemia   . HTN (hypertension)   . CLL (chronic lymphoblastic leukemia)   . Contact lens/glasses fitting     wears contacts or glasses  . Snores   . GERD (gastroesophageal reflux disease)    Past Surgical History  Procedure Laterality Date  . Colonoscopy    . Tonsillectomy     History   Social History  . Marital Status: Married    Spouse Name: N/A    Number of Children: N/A  . Years of Education: N/A   Occupational History  . Facilities Management at [employer]    Social History Main Topics  . Smoking status: Never Smoker   . Smokeless tobacco: Never Used  . Alcohol Use: Yes     Comment: rare  . Drug Use: No  . Sexual Activity: Yes   Other Topics Concern  . None   Social History Narrative   Regular Exercise -  YES   Family History  Problem Relation Age of Onset  . Lung cancer Other   . Hypertension Other   . Cancer Other     Lung Cancer   Allergies  Allergen Reactions  . Benazepril     cough  . Lipitor [Atorvastatin]     myalgias   Prior to Admission medications   Medication Sig Start Date End Date Taking? Authorizing Provider  amLODipine (NORVASC) 5 MG tablet Take 1 tablet (5 mg total) by mouth daily. 08/08/13   Etta Grandchild, MD  aspirin EC 81 MG tablet Take 81 mg by mouth daily.      Historical Provider, MD  esomeprazole (NEXIUM) 40 MG capsule Take 1 capsule (40 mg total) by mouth daily. 06/29/13   Etta Grandchild, MD     Positive ROS: hoarseness  All other systems have been reviewed and were otherwise negative with the exception of those mentioned in the HPI and as above.  Physical Exam: There were no vitals  filed for this visit.  General: Alert, no acute distress Oral: Normal oral mucosa and tonsils Nasal: Clear nasal passages   FOL reveals a right true VC polyp Neck: No palpable adenopathy or thyroid nodules Ear: Ear canal is clear with normal appearing TMs Cardiovascular: Regular rate and rhythm, no murmur.  Respiratory: Clear to auscultation Neurologic: Alert and oriented x 3   Assessment/Plan: HOARSENESS Plan for Procedure(s): MICROLARYNGOSCOPY WITH EXCISION OF VOCAL CORD LESION   Dillard Cannon, MD 09/03/2013 5:14 PM

## 2013-09-04 ENCOUNTER — Ambulatory Visit (HOSPITAL_BASED_OUTPATIENT_CLINIC_OR_DEPARTMENT_OTHER)
Admission: RE | Admit: 2013-09-04 | Discharge: 2013-09-04 | Disposition: A | Discharge status: Home / Self Care | Payer: Medicare PPO | Source: Ambulatory Visit | Attending: Otolaryngology | Admitting: Otolaryngology

## 2013-09-04 ENCOUNTER — Encounter (HOSPITAL_BASED_OUTPATIENT_CLINIC_OR_DEPARTMENT_OTHER): Payer: Medicare PPO | Admitting: Anesthesiology

## 2013-09-04 ENCOUNTER — Encounter (HOSPITAL_BASED_OUTPATIENT_CLINIC_OR_DEPARTMENT_OTHER)
Admission: RE | Disposition: A | Discharge status: Home / Self Care | Payer: Self-pay | Source: Ambulatory Visit | Attending: Otolaryngology

## 2013-09-04 ENCOUNTER — Ambulatory Visit (HOSPITAL_BASED_OUTPATIENT_CLINIC_OR_DEPARTMENT_OTHER): Payer: Medicare PPO | Admitting: Anesthesiology

## 2013-09-04 ENCOUNTER — Encounter (HOSPITAL_BASED_OUTPATIENT_CLINIC_OR_DEPARTMENT_OTHER): Payer: Self-pay

## 2013-09-04 DIAGNOSIS — J381 Polyp of vocal cord and larynx: Secondary | ICD-10-CM | POA: Insufficient documentation

## 2013-09-04 DIAGNOSIS — I10 Essential (primary) hypertension: Secondary | ICD-10-CM | POA: Insufficient documentation

## 2013-09-04 DIAGNOSIS — Z01812 Encounter for preprocedural laboratory examination: Secondary | ICD-10-CM | POA: Insufficient documentation

## 2013-09-04 DIAGNOSIS — Z7982 Long term (current) use of aspirin: Secondary | ICD-10-CM | POA: Insufficient documentation

## 2013-09-04 DIAGNOSIS — Z79899 Other long term (current) drug therapy: Secondary | ICD-10-CM | POA: Insufficient documentation

## 2013-09-04 DIAGNOSIS — E785 Hyperlipidemia, unspecified: Secondary | ICD-10-CM | POA: Insufficient documentation

## 2013-09-04 DIAGNOSIS — C9110 Chronic lymphocytic leukemia of B-cell type not having achieved remission: Secondary | ICD-10-CM | POA: Insufficient documentation

## 2013-09-04 DIAGNOSIS — K219 Gastro-esophageal reflux disease without esophagitis: Secondary | ICD-10-CM | POA: Insufficient documentation

## 2013-09-04 HISTORY — DX: Gastro-esophageal reflux disease without esophagitis: K21.9

## 2013-09-04 HISTORY — DX: Encounter for fitting and adjustment of spectacles and contact lenses: Z46.0

## 2013-09-04 HISTORY — PX: MICROLARYNGOSCOPY WITH CO2 LASER AND EXCISION OF VOCAL CORD LESION: SHX5970

## 2013-09-04 HISTORY — DX: Snoring: R06.83

## 2013-09-04 LAB — POCT HEMOGLOBIN-HEMACUE: Hemoglobin: 15 g/dL (ref 13.0–17.0)

## 2013-09-04 SURGERY — MICROLARYNGOSCOPY WITH CO2 LASER AND EXCISION OF VOCAL CORD LESION
Anesthesia: General | Site: Throat | Laterality: Right

## 2013-09-04 MED ORDER — FENTANYL CITRATE 0.05 MG/ML IJ SOLN: 50.0000 ug | INTRAMUSCULAR | Status: DC | PRN

## 2013-09-04 MED ORDER — MIDAZOLAM HCL 2 MG/2ML IJ SOLN: 1.0000 mg | INTRAMUSCULAR | Status: DC | PRN

## 2013-09-04 MED ORDER — PROPOFOL 10 MG/ML IV BOLUS
INTRAVENOUS | Status: DC | PRN
  Administered 2013-09-04: 170 mg via INTRAVENOUS

## 2013-09-04 MED ORDER — FENTANYL CITRATE 0.05 MG/ML IJ SOLN: 25.0000 ug | INTRAMUSCULAR | Status: DC | PRN

## 2013-09-04 MED ORDER — FENTANYL CITRATE 0.05 MG/ML IJ SOLN
INTRAMUSCULAR | Status: DC | PRN
  Administered 2013-09-04: 100 ug via INTRAVENOUS

## 2013-09-04 MED ORDER — OXYCODONE HCL 5 MG PO TABS: 5.0000 mg | ORAL_TABLET | Freq: Once | ORAL | Status: DC | PRN

## 2013-09-04 MED ORDER — LIDOCAINE HCL (CARDIAC) 20 MG/ML IV SOLN
INTRAVENOUS | Status: DC | PRN
  Administered 2013-09-04: 60 mg via INTRAVENOUS

## 2013-09-04 MED ORDER — DEXAMETHASONE SODIUM PHOSPHATE 4 MG/ML IJ SOLN
INTRAMUSCULAR | Status: DC | PRN
  Administered 2013-09-04: 10 mg via INTRAVENOUS

## 2013-09-04 MED ORDER — LACTATED RINGERS IV SOLN
INTRAVENOUS | Status: DC
Start: 2013-09-04 — End: 2013-09-04
  Administered 2013-09-04: 07:00:00 via INTRAVENOUS

## 2013-09-04 MED ORDER — MIDAZOLAM HCL 5 MG/5ML IJ SOLN
INTRAMUSCULAR | Status: DC | PRN
  Administered 2013-09-04: 2 mg via INTRAVENOUS

## 2013-09-04 MED ORDER — SUCCINYLCHOLINE CHLORIDE 20 MG/ML IJ SOLN
INTRAMUSCULAR | Status: DC | PRN
  Administered 2013-09-04: 100 mg via INTRAVENOUS

## 2013-09-04 MED ORDER — EPINEPHRINE HCL 1 MG/ML IJ SOLN
INTRAMUSCULAR | Status: DC | PRN
  Administered 2013-09-04: 1 mg

## 2013-09-04 MED ORDER — ONDANSETRON HCL 4 MG/2ML IJ SOLN: 4.0000 mg | Freq: Four times a day (QID) | INTRAMUSCULAR | Status: DC | PRN

## 2013-09-04 MED ORDER — OXYCODONE HCL 5 MG/5ML PO SOLN: 5.0000 mg | Freq: Once | ORAL | Status: DC | PRN

## 2013-09-04 MED ORDER — METHYLENE BLUE 1 % INJ SOLN
INTRAMUSCULAR | Status: AC
  Filled 2013-09-04: qty 10

## 2013-09-04 MED ORDER — LACTATED RINGERS IV SOLN
INTRAVENOUS | Status: DC
  Administered 2013-09-04 (×2): via INTRAVENOUS

## 2013-09-04 MED ORDER — EPINEPHRINE HCL 1 MG/ML IJ SOLN
INTRAMUSCULAR | Status: AC
  Filled 2013-09-04: qty 1

## 2013-09-04 MED ORDER — PROPOFOL INFUSION 10 MG/ML OPTIME
INTRAVENOUS | Status: DC | PRN
  Administered 2013-09-04: 50 ug/kg/min via INTRAVENOUS

## 2013-09-04 MED ORDER — FENTANYL CITRATE 0.05 MG/ML IJ SOLN
INTRAMUSCULAR | Status: AC
  Filled 2013-09-04: qty 4

## 2013-09-04 MED ORDER — MIDAZOLAM HCL 2 MG/2ML IJ SOLN
INTRAMUSCULAR | Status: AC
  Filled 2013-09-04: qty 2

## 2013-09-04 MED ORDER — ONDANSETRON HCL 4 MG/2ML IJ SOLN
INTRAMUSCULAR | Status: DC | PRN
  Administered 2013-09-04: 4 mg via INTRAVENOUS

## 2013-09-04 MED ORDER — PROPOFOL 10 MG/ML IV BOLUS
INTRAVENOUS | Status: AC
  Filled 2013-09-04: qty 20

## 2013-09-04 SURGICAL SUPPLY — 22 items
CANISTER SUCT 1200ML W/VALVE (MISCELLANEOUS) ×2 IMPLANT
DEPRESSOR TONGUE BLADE STERILE (MISCELLANEOUS) ×2 IMPLANT
FILTER 7/8 IN (FILTER) ×2 IMPLANT
GAUZE SPONGE 4X4 12PLY STRL LF (GAUZE/BANDAGES/DRESSINGS) ×4 IMPLANT
GLOVE BIOGEL PI IND STRL 7.0 (GLOVE) ×1 IMPLANT
GLOVE BIOGEL PI INDICATOR 7.0 (GLOVE) ×1
GLOVE SS BIOGEL STRL SZ 7.5 (GLOVE) ×1 IMPLANT
GLOVE SUPERSENSE BIOGEL SZ 7.5 (GLOVE) ×1
GOWN PREVENTION PLUS XLARGE (GOWN DISPOSABLE) ×2 IMPLANT
GUARD TEETH (MISCELLANEOUS) ×2 IMPLANT
MARKER SKIN DUAL TIP RULER LAB (MISCELLANEOUS) IMPLANT
NEEDLE SPNL 22GX7 QUINCKE BK (NEEDLE) IMPLANT
NS IRRIG 1000ML POUR BTL (IV SOLUTION) ×2 IMPLANT
PAD EYE OVAL STERILE LF (GAUZE/BANDAGES/DRESSINGS) IMPLANT
PATTIES SURGICAL .5 X3 (DISPOSABLE) ×2 IMPLANT
REDUCTION FITTING 1/4 IN (FILTER) IMPLANT
SHEET MEDIUM DRAPE 40X70 STRL (DRAPES) ×2 IMPLANT
SLEEVE SCD COMPRESS KNEE MED (MISCELLANEOUS) ×2 IMPLANT
SOLUTION BUTLER CLEAR DIP (MISCELLANEOUS) ×2 IMPLANT
SYR CONTROL 10ML LL (SYRINGE) IMPLANT
TOWEL OR 17X24 6PK STRL BLUE (TOWEL DISPOSABLE) ×2 IMPLANT
TUBE CONNECTING 20X1/4 (TUBING) ×4 IMPLANT

## 2013-09-04 NOTE — Anesthesia Postprocedure Evaluation (Signed)
Anesthesia Post Note  Patient: Larry Huang  Procedure(s) Performed: Procedure(s) (LRB): MICROLARYNGOSCOPY WITH EXCISION OF VOCAL CORD LESION (Right)  Anesthesia type: General  Patient location: PACU  Post pain: Pain level controlled and Adequate analgesia  Post assessment: Post-op Vital signs reviewed, Patient's Cardiovascular Status Stable, Respiratory Function Stable, Patent Airway and Pain level controlled  Last Vitals:  Filed Vitals:   09/04/13 0915  BP: 117/73  Pulse: 61  Temp:   Resp: 18    Post vital signs: Reviewed and stable  Level of consciousness: awake, alert  and oriented  Complications: No apparent anesthesia complications

## 2013-09-04 NOTE — Transfer of Care (Signed)
Immediate Anesthesia Transfer of Care Note  Patient: Larry Huang  Procedure(s) Performed: Procedure(s): MICROLARYNGOSCOPY WITH EXCISION OF VOCAL CORD LESION (Right)  Patient Location: PACU  Anesthesia Type:General  Level of Consciousness: sedated  Airway & Oxygen Therapy: Patient Spontanous Breathing and Patient connected to face mask  Post-op Assessment: Report given to PACU RN and Post -op Vital signs reviewed and stable  Post vital signs: Reviewed and stable  Complications: No apparent anesthesia complications

## 2013-09-04 NOTE — Brief Op Note (Signed)
09/04/2013  8:00 AM  PATIENT:  Larry Huang  67 y.o. male  PRE-OPERATIVE DIAGNOSIS:  HOARSENESS  POST-OPERATIVE DIAGNOSIS:  * No post-op diagnosis entered *  PROCEDURE:  Procedure(s): MICROLARYNGOSCOPY WITH EXCISION OF VOCAL CORD LESION (N/A)  SURGEON:  Surgeon(s) and Role:    * Drema Halon, MD - Primary  PHYSICIAN ASSISTANT:   ASSISTANTS: none   ANESTHESIA:   general  EBL:     BLOOD ADMINISTERED:none  DRAINS: none   LOCAL MEDICATIONS USED:  NONE  SPECIMEN:  Source of Specimen:  right vocal cord polyp  DISPOSITION OF SPECIMEN:  PATHOLOGY  COUNTS:  YES  TOURNIQUET:  * No tourniquets in log *  DICTATION: .Other Dictation: Dictation Number [id number]  PLAN OF CARE: Discharge to home after PACU  PATIENT DISPOSITION:  PACU - hemodynamically stable.   Delay start of Pharmacological VTE agent (>24hrs) due to surgical blood loss or risk of bleeding: not applicable

## 2013-09-04 NOTE — Anesthesia Preprocedure Evaluation (Signed)
Anesthesia Evaluation  Patient identified by MRN, date of birth, ID band Patient awake    Reviewed: Allergy & Precautions, H&P , NPO status , Patient's Chart, lab work & pertinent test results  Airway Mallampati: II  Neck ROM: full    Dental   Pulmonary neg pulmonary ROS,          Cardiovascular hypertension,     Neuro/Psych    GI/Hepatic GERD-  ,  Endo/Other    Renal/GU      Musculoskeletal   Abdominal   Peds  Hematology CLL   Anesthesia Other Findings   Reproductive/Obstetrics                           Anesthesia Physical Anesthesia Plan  ASA: II  Anesthesia Plan: General   Post-op Pain Management:    Induction: Intravenous  Airway Management Planned: Oral ETT  Additional Equipment:   Intra-op Plan:   Post-operative Plan: Extubation in OR  Informed Consent: I have reviewed the patients History and Physical, chart, labs and discussed the procedure including the risks, benefits and alternatives for the proposed anesthesia with the patient or authorized representative who has indicated his/her understanding and acceptance.     Plan Discussed with: CRNA, Anesthesiologist and Surgeon  Anesthesia Plan Comments:         Anesthesia Quick Evaluation

## 2013-09-04 NOTE — Anesthesia Procedure Notes (Signed)
Procedure Name: Intubation Performed by: Lance Coon Pre-anesthesia Checklist: Patient identified, Emergency Drugs available, Suction available and Patient being monitored Patient Re-evaluated:Patient Re-evaluated prior to inductionOxygen Delivery Method: Circle System Utilized Preoxygenation: Pre-oxygenation with 100% oxygen Intubation Type: IV induction Ventilation: Mask ventilation without difficulty Laryngoscope Size: Mac and 3 Grade View: Grade II Tube type: Oral Tube size: 6.0 mm Number of attempts: 1 Airway Equipment and Method: stylet and oral airway Placement Confirmation: ETT inserted through vocal cords under direct vision,  positive ETCO2 and breath sounds checked- equal and bilateral Tube secured with: Tape Dental Injury: Teeth and Oropharynx as per pre-operative assessment

## 2013-09-04 NOTE — Interval H&P Note (Signed)
History and Physical Interval Note:  09/04/2013 7:26 AM  Larry Huang  has presented today for surgery, with the diagnosis of HOARSENESS  The various methods of treatment have been discussed with the patient and family. After consideration of risks, benefits and other options for treatment, the patient has consented to  Procedure(s): MICROLARYNGOSCOPY WITH EXCISION OF VOCAL CORD LESION (N/A) as a surgical intervention .  The patient's history has been reviewed, patient examined, no change in status, stable for surgery.  I have reviewed the patient's chart and labs.  Questions were answered to the patient's satisfaction.     NEWMAN, CHRISTOPHER

## 2013-09-05 ENCOUNTER — Encounter (HOSPITAL_BASED_OUTPATIENT_CLINIC_OR_DEPARTMENT_OTHER): Payer: Self-pay | Admitting: Otolaryngology

## 2013-09-06 NOTE — Op Note (Signed)
[name], [name]                ACCOUNT NO.:  [id number]  MEDICAL RECORD NO.:  [id number]  LOCATION:                               FACILITY:  [facility]  PHYSICIAN:  Kristine Garbe. Ezzard Standing, M.D.DATE OF BIRTH:  05/14/1946  DATE OF PROCEDURE:  09/04/2013 DATE OF DISCHARGE:  09/04/2013                              OPERATIVE REPORT   PREOPERATIVE DIAGNOSES:  Hoarseness with right vocal cord polyp.  POSTOPERATIVE DIAGNOSES:  Hoarseness with right vocal cord polyp.  OPERATION:  Microlaryngoscopy with excisional biopsy of right vocal cord lesion.  SURGEON:  Kristine Garbe. Ezzard Standing, M.D.  ANESTHESIA:  General endotracheal.  COMPLICATIONS:  None.  BRIEF CLINICAL NOTE:  Larry Huang is a 67 year old gentleman who has had persistent hoarseness for about 3 months now.  This initially began with an upper respiratory infection.  The infection has now resolved, but he has had persistent hoarseness and on exam has a polypoid lesion extruding from the anterior portion of the right true vocal cord.  It has been persistent despite voice rest.  He was taken to the operating room at this time for microlaryngoscopy and excision of right vocal cord polypoid lesion.  DESCRIPTION OF PROCEDURE:  After adequate endotracheal anesthesia, direct laryngoscopy was performed.  The base of the tongue, epiglottis were all clear.  Both piriform sinuses were clear.  On evaluation of the endolarynx, the patient had a polypoid lesion extruding from the anterior one third of the right true vocal cord.  Had a very small reactive nodule on the left vocal cord.  The laryngoscope was suspended. Then, using cup forceps and micro scissors, the polypoid lesion was excised and sent to Pathology.  This completed the procedure.  Photos were obtained.  The patient was subsequently awoken from anesthesia and transferred to the recovery room, postop doing well.  DISPOSITION:  Larry Huang is discharged home later this morning.   Instructed on voice rest.  Take antacid medication for the next 2 weeks, and he will follow up in my office in 1 week for recheck and review final pathology.          ______________________________ Kristine Garbe Ezzard Standing, M.D.     CEN/MEDQ  D:  09/04/2013  T:  09/05/2013  Job:  [id number]  cc:   Sanda Linger, MD

## 2013-10-30 ENCOUNTER — Ambulatory Visit (HOSPITAL_BASED_OUTPATIENT_CLINIC_OR_DEPARTMENT_OTHER): Payer: Medicare PPO | Admitting: Internal Medicine

## 2013-10-30 ENCOUNTER — Other Ambulatory Visit: Payer: Self-pay | Admitting: Internal Medicine

## 2013-10-30 ENCOUNTER — Telehealth: Payer: Self-pay | Admitting: *Deleted

## 2013-10-30 ENCOUNTER — Other Ambulatory Visit (HOSPITAL_BASED_OUTPATIENT_CLINIC_OR_DEPARTMENT_OTHER): Payer: Medicare PPO

## 2013-10-30 VITALS — BP 140/80 | HR 62 | Temp 97.2°F | Resp 20 | Ht 71.0 in | Wt 175.7 lb

## 2013-10-30 DIAGNOSIS — C9110 Chronic lymphocytic leukemia of B-cell type not having achieved remission: Secondary | ICD-10-CM

## 2013-10-30 DIAGNOSIS — N401 Enlarged prostate with lower urinary tract symptoms: Secondary | ICD-10-CM

## 2013-10-30 DIAGNOSIS — C9111 Chronic lymphocytic leukemia of B-cell type in remission: Secondary | ICD-10-CM

## 2013-10-30 LAB — CBC WITH DIFFERENTIAL/PLATELET
BASO%: 0.4 % (ref 0.0–2.0)
BASOS ABS: 0 10*3/uL (ref 0.0–0.1)
EOS%: 4.3 % (ref 0.0–7.0)
Eosinophils Absolute: 0.4 10*3/uL (ref 0.0–0.5)
HEMATOCRIT: 43.1 % (ref 38.4–49.9)
HGB: 14.5 g/dL (ref 13.0–17.1)
LYMPH%: 39.9 % (ref 14.0–49.0)
MCH: 30.9 pg (ref 27.2–33.4)
MCHC: 33.7 g/dL (ref 32.0–36.0)
MCV: 91.7 fL (ref 79.3–98.0)
MONO#: 0.7 10*3/uL (ref 0.1–0.9)
MONO%: 6.5 % (ref 0.0–14.0)
NEUT%: 48.9 % (ref 39.0–75.0)
NEUTROS ABS: 5 10*3/uL (ref 1.5–6.5)
PLATELETS: 242 10*3/uL (ref 140–400)
RBC: 4.7 10*6/uL (ref 4.20–5.82)
RDW: 13.9 % (ref 11.0–14.6)
WBC: 10.2 10*3/uL (ref 4.0–10.3)
lymph#: 4.1 10*3/uL — ABNORMAL HIGH (ref 0.9–3.3)

## 2013-10-30 LAB — COMPREHENSIVE METABOLIC PANEL (CC13)
ALT: 20 U/L (ref 0–55)
ANION GAP: 7 meq/L (ref 3–11)
AST: 24 U/L (ref 5–34)
Albumin: 4 g/dL (ref 3.5–5.0)
Alkaline Phosphatase: 59 U/L (ref 40–150)
BILIRUBIN TOTAL: 0.64 mg/dL (ref 0.20–1.20)
BUN: 18.8 mg/dL (ref 7.0–26.0)
CO2: 24 meq/L (ref 22–29)
Calcium: 9.3 mg/dL (ref 8.4–10.4)
Chloride: 107 mEq/L (ref 98–109)
Creatinine: 1.2 mg/dL (ref 0.7–1.3)
GLUCOSE: 87 mg/dL (ref 70–140)
Potassium: 4 mEq/L (ref 3.5–5.1)
Sodium: 139 mEq/L (ref 136–145)
TOTAL PROTEIN: 6.7 g/dL (ref 6.4–8.3)

## 2013-10-30 LAB — LACTATE DEHYDROGENASE (CC13): LDH: 229 U/L (ref 125–245)

## 2013-10-30 NOTE — Patient Instructions (Signed)
Chronic Lymphocytic Leukemia Chronic lymphocytic leukemia (CLL) is a type of cancer of the bone marrow and blood cells. Bone marrow is the soft, spongy tissue inside your bone. In CLL, the bone marrow makes too many white blood cells that usually fight infection in the body (lymphocytes). CLL usually gets worse slowly and is the most common type of adult leukemia.  RISK FACTORS No one knows the exact cause of CLL. There is a higher risk of CLL in people who:   Are older than 50 years.  Are white.  Are male.  Have a family history of CLL or other cancers of the lymph system.  Are of Russian Jewish or Eastern European Jewish descent.  Have been exposed to certain chemicals, such as Agent Orange (used in the Vietnam War) or other herbicides or insecticides. SYMPTOMS  At first, there may be no symptoms of chronic lymphocytic leukemia. After a while, some symptoms may occur, such as:   Feeling more tired than usual, even after rest.  Unplanned weight loss.  Heavy sweating at night.  Fevers.  Shortness of breath.  Decreased energy.  Paleness.  Painless, swollen lymph nodes.  A feeling of fullness in the upper left part of the abdomen.  Easy bruising or bleeding.  More frequent infections. DIAGNOSIS  Your health care provider may perform the following exams and tests to diagnose CLL:  Physical exam to check for an enlarged spleen, liver, or lymph nodes.  Blood and bone marrow tests to identify the presence of cancer cells. These may include tests such as complete blood count, flow cytometry, immunophenotyping, and fluorescence in situ hybridization (FISH).  CT scan to look for swelling or abnormalities in your spleen, liver, and lymph nodes. TREATMENT  Treatment options for CLL depend on the stage and the presence of symptoms. There are a number of types of treatment used for this condition, including:  Observation.  Targeted drugs. These are drugs that interfere with  chemicals that leukemia cells need in order to grow and multiply. They identify and attack specific cancer cells without harming normal cells.  Chemotherapy drugs. These medicines kill cells that are multiplying quickly, such as leukemia cells.  Radiation.  Surgery to remove the spleen.  Biological therapy. This treatment boosts the ability of your own immune system to fight the leukemia cells.  Bone marrow or peripheral blood stem cell transplant. This treatment allows the patient to receive very high doses of chemotherapy and/or radiation. These high doses kill the cancer cells but also destroy the bone marrow. After treatment is complete, you are given donor bone marrow or stem cells, which will replace the bone marrow. HOME CARE INSTRUCTIONS   Because you have an increased risk of infection, practice good hand washing and avoid being around people who are ill or being in crowded places.  Because you have an increased risk of bleeding and bruising, avoid contact sports or other rough activities.  Only take over-the-counter or prescription medicines for pain, discomfort, or fever as directed by your health care provider.  Although some of your treatments might affect your appetite, try to eat regular, healthy meals.  If you develop any side effects, such as nausea, diarrhea, rash, white patches in your mouth, a sore throat, difficulty swallowing, or severe fatigue, tell your health care provider. He or she may have recommendations of things you can do to improve symptoms.  Consider learning some ways to cope with the stress of having a chronic illness, such as yoga, meditation,   or participating in a support group. SEEK MEDICAL CARE IF:  You develop chest pains.  You notice pain, swelling or redness anywhere in your legs.  You have pain in your belly (abdomen).  You develop new bruises that are getting bigger.  You have painful or more swollen lymph nodes.  You develop bleeding  from your gums, nose, or in your urine or stools.  You are unable to stop throwing up (vomiting).  You cannot keep liquids down.  You feel lightheaded.  You have a fever or persistent symptoms for more than 2 3 days.  You develop a severe stiff neck or headache. SEEK IMMEDIATE MEDICAL CARE IF:  You have trouble breathing or feel short of breath.  You faint. Document Released: 01/30/2009 Document Revised: 05/16/2013 Document Reviewed: 03/08/2013 [employer] Patient Information 2014 [employer].  

## 2013-10-30 NOTE — Telephone Encounter (Signed)
appts made and printed...td 

## 2013-10-31 NOTE — Progress Notes (Signed)
[place] OFFICE PROGRESS NOTE  [name], MD [facility] [address] [place] [address]  DIAGNOSIS: CHRONIC LYMPHOID LEUKEMIA W/O ACHIEVED REMISSION - Plan: CBC with Differential, Comprehensive metabolic panel (Cmet) - [facility], Lactate dehydrogenase (LDH) - [facility]  HYPERTROPHY PROSTATE W/UR OBST & OTH LUTS  Chief Complaint  Patient presents with  . CHRONIC LYMPHOID LEUKEMIA W/O ACHIEVED REMISSION    CURRENT THERAPY: Observation.    INTERVAL HISTORY: Larry Huang 68 y.o. male with a history of mild lymphocytosis that has the characteristic findings of chronic lymphocytic leukemia on flow cytometry of the peripheral blood on 08/06/2008. He was last seen by Dr. Jilda Roche on 05/01/2013. There have been no changes in his condition. He remains active, feeling well although he admits that he does not have quite as much energy as he used to. There have been no health issues for him, specifically no hospital admissions or surgery. He is without symptoms related to his underlying chronic lymphocytic leukemia. He does report a recent vocal cord polyp which was removed in mid December.    MEDICAL HISTORY: Past Medical History  Diagnosis Date  . Hyperlipidemia   . HTN (hypertension)   . CLL (chronic lymphoblastic leukemia)   . Contact lens/glasses fitting     wears contacts or glasses  . Snores   . GERD (gastroesophageal reflux disease)     INTERIM HISTORY: has CHRONIC LYMPHOID LEUKEMIA W/O ACHIEVED REMISSION; HYPERLIPIDEMIA; Essential hypertension, benign; HYPERTROPHY PROSTATE W/UR OBST & OTH LUTS; Routine general medical examination at a health care facility; Screening, ischemic heart disease; and GERD (gastroesophageal reflux disease) on his problem list.    ALLERGIES:  is allergic to benazepril and lipitor.  MEDICATIONS: has a current medication list which includes the following prescription(s): amlodipine, aspirin ec, and esomeprazole.  SURGICAL  HISTORY:  Past Surgical History  Procedure Laterality Date  . Colonoscopy    . Tonsillectomy    . Microlaryngoscopy with co2 laser and excision of vocal cord lesion Right 09/04/2013    Procedure: MICROLARYNGOSCOPY WITH EXCISION OF VOCAL CORD LESION;  Surgeon: Rozetta Nunnery, MD;  Location: [facility];  Service: ENT;  Laterality: Right;   PROBLEM LIST:  1. Stage 0 chronic lymphocytic leukemia first noted in October 2009  with diagnostic flow cytometry carried out on 08/06/2008.  2. Mild renal insufficiency.  3. Hypertension.  4. Dyslipidemia.  REVIEW OF SYSTEMS:   Constitutional: Denies fevers, chills or abnormal weight loss Eyes: Denies blurriness of vision Ears, nose, mouth, throat, and face: Denies mucositis or sore throat Respiratory: Denies cough, dyspnea or wheezes Cardiovascular: Denies palpitation, chest discomfort or lower extremity swelling Gastrointestinal:  Denies nausea, heartburn or change in bowel habits Skin: Denies abnormal skin rashes Lymphatics: Denies new lymphadenopathy or easy bruising Neurological:Denies numbness, tingling or new weaknesses Behavioral/Psych: Mood is stable, no new changes  All other systems were reviewed with the patient and are negative.  PHYSICAL EXAMINATION: ECOG PERFORMANCE STATUS: 0 - Asymptomatic  Blood pressure 140/80, pulse 62, temperature 97.2 F (36.2 C), temperature source Oral, resp. rate 20, height 5\' 11"  (1.803 m), weight 175 lb 11.2 oz (79.697 kg), SpO2 98.00%.  GENERAL:alert, no distress and comfortable; well developed and well-nourished.  SKIN: skin color, texture, turgor are normal, no rashes or significant lesions EYES: normal, Conjunctiva are pink and non-injected, sclera clear OROPHARYNX:no exudate, no erythema and lips, buccal mucosa, and tongue normal  NECK: supple, thyroid normal size, non-tender, without nodularity LYMPH:  no palpable lymphadenopathy in the cervical, axillary or  supraclavicular LUNGS: clear to auscultation with normal breathing effort HEART: regular rate & rhythm and no murmurs and no lower extremity edema ABDOMEN:abdomen soft, non-tender and normal bowel sounds Musculoskeletal:no cyanosis of digits and no clubbing  NEURO: alert & oriented x 3 with fluent speech, no focal motor/sensory deficits  LABORATORY DATA: Results for orders placed in visit on 10/30/13 (from the past 48 hour(s))  CBC WITH DIFFERENTIAL     Status: Abnormal   Collection Time    10/30/13  2:47 PM      Result Value Range   WBC 10.2  4.0 - 10.3 10e3/uL   NEUT# 5.0  1.5 - 6.5 10e3/uL   HGB 14.5  13.0 - 17.1 g/dL   HCT 43.1  38.4 - 49.9 %   Platelets 242  140 - 400 10e3/uL   MCV 91.7  79.3 - 98.0 fL   MCH 30.9  27.2 - 33.4 pg   MCHC 33.7  32.0 - 36.0 g/dL   RBC 4.70  4.20 - 5.82 10e6/uL   RDW 13.9  11.0 - 14.6 %   lymph# 4.1 (*) 0.9 - 3.3 10e3/uL   MONO# 0.7  0.1 - 0.9 10e3/uL   Eosinophils Absolute 0.4  0.0 - 0.5 10e3/uL   Basophils Absolute 0.0  0.0 - 0.1 10e3/uL   NEUT% 48.9  39.0 - 75.0 %   LYMPH% 39.9  14.0 - 49.0 %   MONO% 6.5  0.0 - 14.0 %   EOS% 4.3  0.0 - 7.0 %   BASO% 0.4  0.0 - 2.0 %  COMPREHENSIVE METABOLIC PANEL (ZO10)     Status: None   Collection Time    10/30/13  2:47 PM      Result Value Range   Sodium 139  136 - 145 mEq/L   Potassium 4.0  3.5 - 5.1 mEq/L   Chloride 107  98 - 109 mEq/L   CO2 24  22 - 29 mEq/L   Glucose 87  70 - 140 mg/dl   BUN 18.8  7.0 - 26.0 mg/dL   Creatinine 1.2  0.7 - 1.3 mg/dL   Total Bilirubin 0.64  0.20 - 1.20 mg/dL   Alkaline Phosphatase 59  40 - 150 U/L   AST 24  5 - 34 U/L   ALT 20  0 - 55 U/L   Total Protein 6.7  6.4 - 8.3 g/dL   Albumin 4.0  3.5 - 5.0 g/dL   Calcium 9.3  8.4 - 10.4 mg/dL   Anion Gap 7  3 - 11 mEq/L  LACTATE DEHYDROGENASE (CC13)     Status: None   Collection Time    10/30/13  2:47 PM      Result Value Range   LDH 229  125 - 245 U/L       Labs:  Lab Results  Component Value Date   WBC  10.2 10/30/2013   HGB 14.5 10/30/2013   HCT 43.1 10/30/2013   MCV 91.7 10/30/2013   PLT 242 10/30/2013   NEUTROABS 5.0 10/30/2013      Chemistry      Component Value Date/Time   NA 139 10/30/2013 1447   NA 137 09/03/2013 1215   K 4.0 10/30/2013 1447   K 4.3 09/03/2013 1215   CL 103 09/03/2013 1215   CO2 24 10/30/2013 1447   CO2 23 09/03/2013 1215   BUN 18.8 10/30/2013 1447   BUN 17 09/03/2013 1215   CREATININE  1.2 10/30/2013 1447   CREATININE 1.17 09/03/2013 1215      Component Value Date/Time   CALCIUM 9.3 10/30/2013 1447   CALCIUM 9.5 09/03/2013 1215   ALKPHOS 59 10/30/2013 1447   ALKPHOS 52 10/03/2012 0930   AST 24 10/30/2013 1447   AST 27 10/03/2012 0930   ALT 20 10/30/2013 1447   ALT 23 10/03/2012 0930   BILITOT 0.64 10/30/2013 1447   BILITOT 1.4* 10/03/2012 0930     Basic Metabolic Panel:  Recent Labs Lab 10/30/13 1447  NA 139  K 4.0  CO2 24  GLUCOSE 87  BUN 18.8  CREATININE 1.2  CALCIUM 9.3   GFR Estimated Creatinine Clearance: 61.9 ml/min (by C-G formula based on Cr of 1.2). Liver Function Tests:  Recent Labs Lab 10/30/13 1447  AST 24  ALT 20  ALKPHOS 59  BILITOT 0.64  PROT 6.7  ALBUMIN 4.0   CBC:  Recent Labs Lab 10/30/13 1447  WBC 10.2  NEUTROABS 5.0  HGB 14.5  HCT 43.1  MCV 91.7  PLT 242   Studies:  No results found.   RADIOGRAPHIC STUDIES: No results found.  ASSESSMENT: Larry Huang 68 y.o. male with a history of CHRONIC LYMPHOID LEUKEMIA W/O ACHIEVED REMISSION - Plan: CBC with Differential, Comprehensive metabolic panel (Cmet) - [facility], Lactate dehydrogenase (LDH) - [facility]  HYPERTROPHY PROSTATE W/UR OBST & OTH LUTS   PLAN:   1. CLL Stage 0. --Mr. Arizola remains with asymptomatic stage 0 chronic lymphocytic leukemia. His CBC is virtually normal and his prognosis is excellent.   2. Follow-up. --We will plan to see Mr. Westermann again in 6 moths, at which time we will check CBC and chemistries.   All questions were answered. The patient knows to call the clinic with  any problems, questions or concerns. We can certainly see the patient much sooner if necessary.  I spent 10 minutes counseling the patient face to face. The total time spent in the appointment was 15 minutes.    Crista Nuon, MD 10/31/2013 2:39 PM

## 2014-03-27 ENCOUNTER — Encounter: Payer: Medicare PPO | Admitting: Internal Medicine

## 2014-04-02 ENCOUNTER — Ambulatory Visit (INDEPENDENT_AMBULATORY_CARE_PROVIDER_SITE_OTHER): Payer: Medicare PPO | Admitting: Internal Medicine

## 2014-04-02 ENCOUNTER — Telehealth: Payer: Self-pay | Admitting: Internal Medicine

## 2014-04-02 ENCOUNTER — Encounter: Payer: Self-pay | Admitting: Internal Medicine

## 2014-04-02 ENCOUNTER — Other Ambulatory Visit (INDEPENDENT_AMBULATORY_CARE_PROVIDER_SITE_OTHER): Payer: Medicare PPO

## 2014-04-02 VITALS — BP 122/78 | HR 58 | Temp 98.6°F | Resp 16 | Ht 71.0 in | Wt 173.0 lb

## 2014-04-02 DIAGNOSIS — N401 Enlarged prostate with lower urinary tract symptoms: Secondary | ICD-10-CM

## 2014-04-02 DIAGNOSIS — N138 Other obstructive and reflux uropathy: Secondary | ICD-10-CM

## 2014-04-02 DIAGNOSIS — C9110 Chronic lymphocytic leukemia of B-cell type not having achieved remission: Secondary | ICD-10-CM

## 2014-04-02 DIAGNOSIS — E785 Hyperlipidemia, unspecified: Secondary | ICD-10-CM

## 2014-04-02 DIAGNOSIS — I10 Essential (primary) hypertension: Secondary | ICD-10-CM

## 2014-04-02 DIAGNOSIS — K219 Gastro-esophageal reflux disease without esophagitis: Secondary | ICD-10-CM

## 2014-04-02 DIAGNOSIS — Z0000 Encounter for general adult medical examination without abnormal findings: Secondary | ICD-10-CM

## 2014-04-02 LAB — LIPID PANEL
CHOLESTEROL: 263 mg/dL — AB (ref 0–200)
HDL: 40.2 mg/dL (ref >39.00)
LDL Cholesterol: 170 mg/dL — ABNORMAL HIGH (ref 0–99)
NONHDL: 222.8
Total CHOL/HDL Ratio: 7
Triglycerides: 264 mg/dL — ABNORMAL HIGH (ref 0.0–149.0)
VLDL: 52.8 mg/dL — ABNORMAL HIGH (ref 0.0–40.0)

## 2014-04-02 LAB — BASIC METABOLIC PANEL
BUN: 18 mg/dL (ref 6–23)
CALCIUM: 9.4 mg/dL (ref 8.4–10.5)
CO2: 27 mEq/L (ref 19–32)
Chloride: 103 mEq/L (ref 96–112)
Creatinine, Ser: 1 mg/dL (ref 0.4–1.5)
GFR: 78.47 mL/min (ref >60.00)
GLUCOSE: 91 mg/dL (ref 70–99)
Potassium: 4.5 mEq/L (ref 3.5–5.1)
SODIUM: 136 meq/L (ref 135–145)

## 2014-04-02 LAB — PSA: PSA: 0.77 ng/mL (ref 0.10–4.00)

## 2014-04-02 LAB — FECAL OCCULT BLOOD, GUAIAC: FECAL OCCULT BLD: NEGATIVE

## 2014-04-02 NOTE — Assessment & Plan Note (Signed)
His BP is well controlled 

## 2014-04-02 NOTE — Progress Notes (Signed)
Subjective:    Patient ID: Larry Huang, male    DOB: 02/05/1946, 68 y.o.   MRN: [id number]  Hypertension This is a chronic problem. The current episode started more than 1 year ago. The problem is unchanged. The problem is controlled. Pertinent negatives include no anxiety, blurred vision, chest pain, headaches, malaise/fatigue, neck pain, orthopnea, palpitations, peripheral edema, PND, shortness of breath or sweats. There are no associated agents to hypertension. Past treatments include calcium channel blockers. There are no compliance problems.       Review of Systems  Constitutional: Negative.  Negative for fever, chills, malaise/fatigue, diaphoresis, appetite change and fatigue.  HENT: Negative.   Eyes: Negative.  Negative for blurred vision.  Respiratory: Negative.  Negative for cough, choking, chest tightness, shortness of breath and stridor.   Cardiovascular: Negative.  Negative for chest pain, palpitations, orthopnea, leg swelling and PND.  Gastrointestinal: Negative.  Negative for nausea, vomiting, abdominal pain, diarrhea, constipation and blood in stool.  Endocrine: Negative.   Genitourinary: Negative.   Musculoskeletal: Negative.  Negative for arthralgias, back pain, myalgias and neck pain.  Skin: Negative.  Negative for color change and rash.  Allergic/Immunologic: Negative.   Neurological: Negative.  Negative for dizziness, tremors, syncope, weakness, light-headedness, numbness and headaches.  Hematological: Negative.  Negative for adenopathy. Does not bruise/bleed easily.  Psychiatric/Behavioral: Negative.        Objective:   Physical Exam  Vitals reviewed. Constitutional: He is oriented to person, place, and time. He appears well-developed and well-nourished. No distress.  HENT:  Head: Normocephalic and atraumatic.  Mouth/Throat: Oropharynx is clear and moist. No oropharyngeal exudate.  Eyes: Conjunctivae are normal. Right eye exhibits no discharge. Left eye  exhibits no discharge. No scleral icterus.  Neck: Normal range of motion. Neck supple. No JVD present. No tracheal deviation present. No thyromegaly present.  Cardiovascular: Normal rate, regular rhythm, normal heart sounds and intact distal pulses.  Exam reveals no gallop and no friction rub.   No murmur heard. Pulmonary/Chest: Effort normal and breath sounds normal. No stridor. No respiratory distress. He has no wheezes. He has no rales. He exhibits no tenderness.  Abdominal: Soft. Bowel sounds are normal. He exhibits no distension and no mass. There is no tenderness. There is no rebound and no guarding. Hernia confirmed negative in the right inguinal area and confirmed negative in the left inguinal area.  Genitourinary: Rectum normal, testes normal and penis normal. Rectal exam shows no external hemorrhoid, no internal hemorrhoid, no fissure, no mass, no tenderness and anal tone normal. Guaiac negative stool. Prostate is enlarged (1 smooth symm BPH). Prostate is not tender. Right testis shows no mass, no swelling and no tenderness. Right testis is descended. Left testis shows no mass, no swelling and no tenderness. Left testis is descended. Circumcised. No penile erythema or penile tenderness. No discharge found.  Musculoskeletal: Normal range of motion. He exhibits no edema and no tenderness.  Lymphadenopathy:    He has no cervical adenopathy.       Right: No inguinal adenopathy present.       Left: No inguinal adenopathy present.  Neurological: He is oriented to person, place, and time.  Skin: Skin is warm and dry. No rash noted. He is not diaphoretic. No erythema. No pallor.  Psychiatric: He has a normal mood and affect. His behavior is normal. Judgment and thought content normal.     Lab Results  Component Value Date   WBC 10.2 10/30/2013   HGB 14.5 10/30/2013  HCT 43.1 10/30/2013   PLT 242 10/30/2013   GLUCOSE 87 10/30/2013   CHOL 189 10/03/2012   TRIG 154.0* 10/03/2012   HDL 36.70* 10/03/2012    LDLCALC 122* 10/03/2012   ALT 20 10/30/2013   AST 24 10/30/2013   NA 139 10/30/2013   K 4.0 10/30/2013   CL 103 09/03/2013   CREATININE 1.2 10/30/2013   BUN 18.8 10/30/2013   CO2 24 10/30/2013   TSH 1.35 10/03/2012   PSA 0.60 10/03/2012       Assessment & Plan:

## 2014-04-02 NOTE — Patient Instructions (Signed)
Hypertension Hypertension, commonly called high blood pressure, is when the force of blood pumping through your arteries is too strong. Your arteries are the blood vessels that carry blood from your heart throughout your body. A blood pressure reading consists of a higher number over a lower number, such as 110/72. The higher number (systolic) is the pressure inside your arteries when your heart pumps. The lower number (diastolic) is the pressure inside your arteries when your heart relaxes. Ideally you want your blood pressure below 120/80. Hypertension forces your heart to work harder to pump blood. Your arteries may become narrow or stiff. Having hypertension puts you at risk for heart disease, stroke, and other problems.  RISK FACTORS Some risk factors for high blood pressure are controllable. Others are not.  Risk factors you cannot control include:   Race. You may be at higher risk if you are African American.  Age. Risk increases with age.  Gender. Men are at higher risk than women before age 45 years. After age 65, women are at higher risk than men. Risk factors you can control include:  Not getting enough exercise or physical activity.  Being overweight.  Getting too much fat, sugar, calories, or salt in your diet.  Drinking too much alcohol. SIGNS AND SYMPTOMS Hypertension does not usually cause signs or symptoms. Extremely high blood pressure (hypertensive crisis) may cause headache, anxiety, shortness of breath, and nosebleed. DIAGNOSIS  To check if you have hypertension, your health care provider will measure your blood pressure while you are seated, with your arm held at the level of your heart. It should be measured at least twice using the same arm. Certain conditions can cause a difference in blood pressure between your right and left arms. A blood pressure reading that is higher than normal on one occasion does not mean that you need treatment. If one blood pressure reading  is high, ask your health care provider about having it checked again. TREATMENT  Treating high blood pressure includes making lifestyle changes and possibly taking medication. Living a healthy lifestyle can help lower high blood pressure. You may need to change some of your habits. Lifestyle changes may include:  Following the DASH diet. This diet is high in fruits, vegetables, and whole grains. It is low in salt, red meat, and added sugars.  Getting at least 2 1/2 hours of brisk physical activity every week.  Losing weight if necessary.  Not smoking.  Limiting alcoholic beverages.  Learning ways to reduce stress. If lifestyle changes are not enough to get your blood pressure under control, your health care provider may prescribe medicine. You may need to take more than one. Work closely with your health care provider to understand the risks and benefits. HOME CARE INSTRUCTIONS  Have your blood pressure rechecked as directed by your health care provider.   Only take medicine as directed by your health care provider. Follow the directions carefully. Blood pressure medicines must be taken as prescribed. The medicine does not work as well when you skip doses. Skipping doses also puts you at risk for problems.   Do not smoke.   Monitor your blood pressure at home as directed by your health care provider. SEEK MEDICAL CARE IF:   You think you are having a reaction to medicines taken.  You have recurrent headaches or feel dizzy.  You have swelling in your ankles.  You have trouble with your vision. SEEK IMMEDIATE MEDICAL CARE IF:  You develop a severe headache or   confusion.  You have unusual weakness, numbness, or feel faint.  You have severe chest or abdominal pain.  You vomit repeatedly.  You have trouble breathing. MAKE SURE YOU:   Understand these instructions.  Will watch your condition.  Will get help right away if you are not doing well or get  worse. Document Released: 09/13/2005 Document Revised: 09/18/2013 Document Reviewed: 07/06/2013 [facility] Patient Information 2015 [place], [place]. This information is not intended to replace advice given to you by your health care provider. Make sure you discuss any questions you have with your health care provider. Health Maintenance A healthy lifestyle and preventative care can promote health and wellness.  Maintain regular health, dental, and eye exams.  Eat a healthy diet. Foods like vegetables, fruits, whole grains, low-fat dairy products, and lean protein foods contain the nutrients you need and are low in calories. Decrease your intake of foods high in solid fats, added sugars, and salt. Get information about a proper diet from your health care provider, if necessary.  Regular physical exercise is one of the most important things you can do for your health. Most adults should get at least 150 minutes of moderate-intensity exercise (any activity that increases your heart rate and causes you to sweat) each week. In addition, most adults need muscle-strengthening exercises on 2 or more days a week.   Maintain a healthy weight. The body mass index (BMI) is a screening tool to identify possible weight problems. It provides an estimate of body fat based on height and weight. Your health care provider can find your BMI and can help you achieve or maintain a healthy weight. For males 20 years and older:  A BMI below 18.5 is considered underweight.  A BMI of 18.5 to 24.9 is normal.  A BMI of 25 to 29.9 is considered overweight.  A BMI of 30 and above is considered obese.  Maintain normal blood lipids and cholesterol by exercising and minimizing your intake of saturated fat. Eat a balanced diet with plenty of fruits and vegetables. Blood tests for lipids and cholesterol should begin at age 3 and be repeated every 5 years. If your lipid or cholesterol levels are high, you are over age 1, or you  are at high risk for heart disease, you may need your cholesterol levels checked more frequently.Ongoing high lipid and cholesterol levels should be treated with medicines if diet and exercise are not working.  If you smoke, find out from your health care provider how to quit. If you do not use tobacco, do not start.  Lung cancer screening is recommended for adults aged 62-80 years who are at high risk for developing lung cancer because of a history of smoking. A yearly low-dose CT scan of the lungs is recommended for people who have at least a 30-pack-year history of smoking and are current smokers or have quit within the past 15 years. A pack year of smoking is smoking an average of 1 pack of cigarettes a day for 1 year (for example, a 30-pack-year history of smoking could mean smoking 1 pack a day for 30 years or 2 packs a day for 15 years). Yearly screening should continue until the smoker has stopped smoking for at least 15 years. Yearly screening should be stopped for people who develop a health problem that would prevent them from having lung cancer treatment.  If you choose to drink alcohol, do not have more than 2 drinks per day. One drink is considered to be  12 oz (360 mL) of beer, 5 oz (150 mL) of wine, or 1.5 oz (45 mL) of liquor.  Avoid the use of street drugs. Do not share needles with anyone. Ask for help if you need support or instructions about stopping the use of drugs.  High blood pressure causes heart disease and increases the risk of stroke. Blood pressure should be checked at least every 1-2 years. Ongoing high blood pressure should be treated with medicines if weight loss and exercise are not effective.  If you are 56-53 years old, ask your health care provider if you should take aspirin to prevent heart disease.  Diabetes screening involves taking a blood sample to check your fasting blood sugar level. This should be done once every 3 years after age 7 if you are at a normal  weight and without risk factors for diabetes. Testing should be considered at a younger age or be carried out more frequently if you are overweight and have at least 1 risk factor for diabetes.  Colorectal cancer can be detected and often prevented. Most routine colorectal cancer screening begins at the age of 36 and continues through age 62. However, your health care provider may recommend screening at an earlier age if you have risk factors for colon cancer. On a yearly basis, your health care provider may provide home test kits to check for hidden blood in the stool. A small camera at the end of a tube may be used to directly examine the colon (sigmoidoscopy or colonoscopy) to detect the earliest forms of colorectal cancer. Talk to your health care provider about this at age 51 when routine screening begins. A direct exam of the colon should be repeated every 5-10 years through age 42, unless early forms of precancerous polyps or small growths are found.  People who are at an increased risk for hepatitis B should be screened for this virus. You are considered at high risk for hepatitis B if:  You were born in a country where hepatitis B occurs often. Talk with your health care provider about which countries are considered high risk.  Your parents were born in a high-risk country and you have not received a shot to protect against hepatitis B (hepatitis B vaccine).  You have HIV or AIDS.  You use needles to inject street drugs.  You live with, or have sex with, someone who has hepatitis B.  You are a man who has sex with other men (MSM).  You get hemodialysis treatment.  You take certain medicines for conditions like cancer, organ transplantation, and autoimmune conditions.  Hepatitis C blood testing is recommended for all people born from [birth year] through 1967 and any individual with known risk factors for hepatitis C.  Healthy men should no longer receive prostate-specific antigen (PSA) blood  tests as part of routine cancer screening. Talk to your health care provider about prostate cancer screening.  Testicular cancer screening is not recommended for adolescents or adult males who have no symptoms. Screening includes self-exam, a health care provider exam, and other screening tests. Consult with your health care provider about any symptoms you have or any concerns you have about testicular cancer.  Practice safe sex. Use condoms and avoid high-risk sexual practices to reduce the spread of sexually transmitted infections (STIs).  You should be screened for STIs, including gonorrhea and chlamydia if:  You are sexually active and are younger than 24 years.  You are older than 24 years, and your health care provider  tells you that you are at risk for this type of infection.  Your sexual activity has changed since you were last screened, and you are at an increased risk for chlamydia or gonorrhea. Ask your health care provider if you are at risk.  If you are at risk of being infected with HIV, it is recommended that you take a prescription medicine daily to prevent HIV infection. This is called pre-exposure prophylaxis (PrEP). You are considered at risk if:  You are a man who has sex with other men (MSM).  You are a heterosexual man who is sexually active with multiple partners.  You take drugs by injection.  You are sexually active with a partner who has HIV.  Talk with your health care provider about whether you are at high risk of being infected with HIV. If you choose to begin PrEP, you should first be tested for HIV. You should then be tested every 3 months for as long as you are taking PrEP.  Use sunscreen. Apply sunscreen liberally and repeatedly throughout the day. You should seek shade when your shadow is shorter than you. Protect yourself by wearing long sleeves, pants, a wide-brimmed hat, and sunglasses year round whenever you are outdoors.  Tell your health care  provider of new moles or changes in moles, especially if there is a change in shape or color. Also, tell your health care provider if a mole is larger than the size of a pencil eraser.  A one-time screening for abdominal aortic aneurysm (AAA) and surgical repair of large AAAs by ultrasound is recommended for men aged 44-75 years who are current or former smokers.  Stay current with your vaccines (immunizations). Document Released: 03/11/2008 Document Revised: 09/18/2013 Document Reviewed: 02/08/2011 [facility] Patient Information 2015 [place], [place]. This information is not intended to replace advice given to you by your health care provider. Make sure you discuss any questions you have with your health care provider.

## 2014-04-02 NOTE — Progress Notes (Signed)
Pre visit review using our clinic review tool, if applicable. No additional management support is needed unless otherwise documented below in the visit note. 

## 2014-04-02 NOTE — Assessment & Plan Note (Signed)
He has stopped taking nexium and feels well

## 2014-04-02 NOTE — Assessment & Plan Note (Addendum)

## 2014-04-02 NOTE — Telephone Encounter (Signed)
Relevant patient education assigned to patient using Emmi. ° °

## 2014-04-02 NOTE — Assessment & Plan Note (Signed)
He will let me know if he is willing to take a statin

## 2014-04-24 ENCOUNTER — Telehealth: Payer: Self-pay | Admitting: Internal Medicine

## 2014-04-24 NOTE — Telephone Encounter (Signed)
, °

## 2014-04-26 ENCOUNTER — Telehealth: Payer: Self-pay | Admitting: Internal Medicine

## 2014-04-26 NOTE — Telephone Encounter (Signed)
, °

## 2014-04-29 ENCOUNTER — Ambulatory Visit: Payer: Medicare PPO

## 2014-04-29 ENCOUNTER — Other Ambulatory Visit: Payer: Medicare PPO

## 2014-04-29 ENCOUNTER — Ambulatory Visit: Payer: Medicare PPO | Admitting: Oncology

## 2014-05-14 ENCOUNTER — Other Ambulatory Visit: Payer: Medicare PPO

## 2014-05-14 ENCOUNTER — Ambulatory Visit: Payer: Medicare PPO

## 2014-05-16 ENCOUNTER — Other Ambulatory Visit (HOSPITAL_BASED_OUTPATIENT_CLINIC_OR_DEPARTMENT_OTHER): Payer: Medicare PPO

## 2014-05-16 ENCOUNTER — Ambulatory Visit (HOSPITAL_BASED_OUTPATIENT_CLINIC_OR_DEPARTMENT_OTHER): Payer: Medicare PPO | Admitting: Internal Medicine

## 2014-05-16 ENCOUNTER — Telehealth: Payer: Self-pay | Admitting: Internal Medicine

## 2014-05-16 VITALS — BP 131/74 | HR 63 | Temp 98.1°F | Resp 18 | Ht 71.0 in | Wt 172.0 lb

## 2014-05-16 DIAGNOSIS — C9110 Chronic lymphocytic leukemia of B-cell type not having achieved remission: Secondary | ICD-10-CM

## 2014-05-16 LAB — CBC WITH DIFFERENTIAL/PLATELET
BASO%: 0.4 % (ref 0.0–2.0)
Basophils Absolute: 0 10*3/uL (ref 0.0–0.1)
EOS ABS: 0.2 10*3/uL (ref 0.0–0.5)
EOS%: 2 % (ref 0.0–7.0)
HEMATOCRIT: 48.1 % (ref 38.4–49.9)
HEMOGLOBIN: 16 g/dL (ref 13.0–17.1)
LYMPH#: 4.3 10*3/uL — AB (ref 0.9–3.3)
LYMPH%: 36.6 % (ref 14.0–49.0)
MCH: 30.9 pg (ref 27.2–33.4)
MCHC: 33.1 g/dL (ref 32.0–36.0)
MCV: 93.3 fL (ref 79.3–98.0)
MONO#: 1 10*3/uL — AB (ref 0.1–0.9)
MONO%: 8.3 % (ref 0.0–14.0)
NEUT%: 52.7 % (ref 39.0–75.0)
NEUTROS ABS: 6.2 10*3/uL (ref 1.5–6.5)
Platelets: 212 10*3/uL (ref 140–400)
RBC: 5.16 10*6/uL (ref 4.20–5.82)
RDW: 13.6 % (ref 11.0–14.6)
WBC: 11.7 10*3/uL — AB (ref 4.0–10.3)

## 2014-05-16 LAB — COMPREHENSIVE METABOLIC PANEL (CC13)
ALT: 19 U/L (ref 0–55)
ANION GAP: 8 meq/L (ref 3–11)
AST: 24 U/L (ref 5–34)
Albumin: 3.8 g/dL (ref 3.5–5.0)
Alkaline Phosphatase: 60 U/L (ref 40–150)
BUN: 18.3 mg/dL (ref 7.0–26.0)
CHLORIDE: 107 meq/L (ref 98–109)
CO2: 25 mEq/L (ref 22–29)
Calcium: 9.2 mg/dL (ref 8.4–10.4)
Creatinine: 1.1 mg/dL (ref 0.7–1.3)
GLUCOSE: 81 mg/dL (ref 70–140)
Potassium: 4.4 mEq/L (ref 3.5–5.1)
Sodium: 140 mEq/L (ref 136–145)
TOTAL PROTEIN: 6.7 g/dL (ref 6.4–8.3)
Total Bilirubin: 1.06 mg/dL (ref 0.20–1.20)

## 2014-05-16 LAB — LACTATE DEHYDROGENASE (CC13): LDH: 222 U/L (ref 125–245)

## 2014-05-16 NOTE — Patient Instructions (Signed)
Chronic Lymphocytic Leukemia  Chronic lymphocytic leukemia (CLL) is a type of cancer of the bone marrow and blood cells. Bone marrow is the soft, spongy tissue inside your bone. In CLL, the bone marrow makes too many white blood cells that usually fight infection in the body (lymphocytes). CLL usually gets worse slowly and is the most common type of adult leukemia.   RISK FACTORS  No one knows the exact cause of CLL. There is a higher risk of CLL in people who:   · Are older than 50 years.  · Are white.  · Are male.  · Have a family history of CLL or other cancers of the lymph system.  · Are of Russian Jewish or Eastern European Jewish descent.  · Have been exposed to certain chemicals, such as Agent Orange (used in the Vietnam War) or other herbicides or insecticides.  SYMPTOMS   At first, there may be no symptoms of chronic lymphocytic leukemia. After a while, some symptoms may occur, such as:   · Feeling more tired than usual, even after rest.  · Unplanned weight loss.  · Heavy sweating at night.  · Fevers.  · Shortness of breath.  · Decreased energy.  · Paleness.  · Painless, swollen lymph nodes.  · A feeling of fullness in the upper left part of the abdomen.  · Easy bruising or bleeding.  · More frequent infections.  DIAGNOSIS   Your health care provider may perform the following exams and tests to diagnose CLL:  · Physical exam to check for an enlarged spleen, liver, or lymph nodes.  · Blood and bone marrow tests to identify the presence of cancer cells. These may include tests such as complete blood count, flow cytometry, immunophenotyping, and fluorescence in situ hybridization (FISH).  · CT scan to look for swelling or abnormalities in your spleen, liver, and lymph nodes.  TREATMENT   Treatment options for CLL depend on the stage and the presence of symptoms. There are a number of types of treatment used for this condition, including:  · Observation.  · Targeted drugs. These are drugs that interfere with  chemicals that leukemia cells need in order to grow and multiply. They identify and attack specific cancer cells without harming normal cells.  · Chemotherapy drugs. These medicines kill cells that are multiplying quickly, such as leukemia cells.  · Radiation.  · Surgery to remove the spleen.  · Biological therapy. This treatment boosts the ability of your own immune system to fight the leukemia cells.  · Bone marrow or peripheral blood stem cell transplant. This treatment allows the patient to receive very high doses of chemotherapy or radiation or both. These high doses kill the cancer cells but also destroy the bone marrow. After treatment is complete, you are given donor bone marrow or stem cells, which will replace the bone marrow.  HOME CARE INSTRUCTIONS   · Because you have an increased risk of infection, practice good hand washing and avoid being around people who are ill or being in crowded places.  · Because you have an increased risk of bleeding and bruising, avoid contact sports or other rough activities.  · Take medicines only directed by your health care provider.  · Although some of your treatments might affect your appetite, try to eat regular, healthy meals.  · If you develop any side effects, such as nausea, diarrhea, rash, white patches in your mouth, a sore throat, difficulty swallowing, or severe fatigue, tell your health   care provider. He or she may have recommendations of things you can do to improve symptoms.  · Consider learning some ways to cope with the stress of having a chronic illness, such as yoga, meditation, or participating in a support group.  SEEK MEDICAL CARE IF:  · You develop chest pains.  · You notice pain, swelling or redness anywhere in your legs.  · You have pain in your belly (abdomen).  · You develop new bruises that are getting bigger.  · You have painful or more swollen lymph nodes.  · You develop bleeding from your gums, nose, or in your urine or stools.  · You are  unable to stop throwing up (vomiting).  · You cannot keep liquids down.  · You feel light-headed.  · You have a fever.  · You develop a severe stiff neck or headache.  SEEK IMMEDIATE MEDICAL CARE IF:  · You have trouble breathing or feel short of breath.  · You faint.  Document Released: 01/30/2009 Document Revised: 01/28/2014 Document Reviewed: 03/08/2013  [employer]® Patient Information ©2015 [employer]. This information is not intended to replace advice given to you by your health care provider. Make sure you discuss any questions you have with your health care provider.

## 2014-05-16 NOTE — Telephone Encounter (Signed)
Pt confirmed labs/ov per 08/20 POF, gave pt AVS...KJ °

## 2014-05-16 NOTE — Progress Notes (Signed)
[facility] OFFICE PROGRESS NOTE  [name], MD [place] [facility] [address] [place] [address]  DIAGNOSIS: CHRONIC LYMPHOID LEUKEMIA W/O ACHIEVED REMISSION  Chief Complaint  Patient presents with  . CHRONIC LYMPHOID LEUKEMIA W/O ACHIEVED REMISSION    CURRENT TREATMENT:  Observation.   INTERVAL HISTORY: Larry Huang 68 y.o. male with a history of mild lymphocytosis that has characteristic findinds of chronic lymphocytic leukemia on flow cytometry (08/06/2008) is here for annual follow up.  He was last seen by Dr. Jilda Roche on 05/01/2013.  He remains active and he reports no recent hospitalizations or emergency room visits.  His weight is stable.  He denies recurrent night sweats or infections. He had his colonoscopy eight years ago per Dr. Marcello Moores Jone's office.  He will schedule for repeat in two years.  He is a retired Environmental manager, Biochemist, clinical.    MEDICAL HISTORY: Past Medical History  Diagnosis Date  . Hyperlipidemia   . HTN (hypertension)   . CLL (chronic lymphoblastic leukemia)   . Contact lens/glasses fitting     wears contacts or glasses  . Snores   . GERD (gastroesophageal reflux disease)     INTERIM HISTORY: has CHRONIC LYMPHOID LEUKEMIA W/O ACHIEVED REMISSION; Hyperlipidemia with target LDL less than 130; Essential hypertension, benign; HYPERTROPHY PROSTATE W/UR OBST & OTH LUTS; Routine general medical examination at a health care facility; Screening, ischemic heart disease; and GERD (gastroesophageal reflux disease) on his problem list.    ALLERGIES:  is allergic to benazepril and lipitor.  MEDICATIONS: has a current medication list which includes the following prescription(s): amlodipine and aspirin ec.  SURGICAL HISTORY:  Past Surgical History  Procedure Laterality Date  . Colonoscopy    . Tonsillectomy    . Microlaryngoscopy with co2 laser and excision of vocal cord lesion Right 09/04/2013    Procedure: MICROLARYNGOSCOPY WITH  EXCISION OF VOCAL CORD LESION;  Surgeon: Rozetta Nunnery, MD;  Location: [facility];  Service: ENT;  Laterality: Right;   PROBLEM LIST:  1. Stage 0 chronic lymphocytic leukemia first noted in October 2009 with diagnostic flow cytometry carried out on 08/06/2008.  2. Mild renal insufficiency.  3. Hypertension.  4. Dyslipidemia.  REVIEW OF SYSTEMS:   Constitutional: Denies fevers, chills or abnormal weight loss Eyes: Denies blurriness of vision Ears, nose, mouth, throat, and face: Denies mucositis or sore throat Respiratory: Denies cough, dyspnea or wheezes Cardiovascular: Denies palpitation, chest discomfort or lower extremity swelling Gastrointestinal:  Denies nausea, heartburn or change in bowel habits Skin: Denies abnormal skin rashes Lymphatics: Denies new lymphadenopathy or easy bruising Neurological:Denies numbness, tingling or new weaknesses Behavioral/Psych: Mood is stable, no new changes  All other systems were reviewed with the patient and are negative.  PHYSICAL EXAMINATION: ECOG PERFORMANCE STATUS: 0 - Asymptomatic  Blood pressure 131/74, pulse 63, temperature 98.1 F (36.7 C), temperature source Oral, resp. rate 18, height 5\' 11"  (1.803 m), weight 172 lb (78.019 kg), SpO2 99.00%.  GENERAL:alert, no distress and comfortable; well developed and well nourished.   SKIN: skin color, texture, turgor are normal, no rashes or significant lesions EYES: normal, Conjunctiva are pink and non-injected, sclera clear; L eye with contacts on the left eye.  OROPHARYNX:no exudate, no erythema and lips, buccal mucosa, and tongue normal  NECK: supple, thyroid normal size, non-tender, without nodularity LYMPH:  no palpable lymphadenopathy in the cervical, axillary or supraclavicular LUNGS: clear to auscultation with normal breathing effort, no wheezes or rhonchi HEART: regular rate &  rhythm and no murmurs and no lower extremity edema ABDOMEN:abdomen soft, non-tender and  normal bowel sounds Musculoskeletal:no cyanosis of digits and no clubbing  NEURO: alert & oriented x 3 with fluent speech, no focal motor/sensory deficits  Labs:  Lab Results  Component Value Date   WBC 11.7* 05/16/2014   HGB 16.0 05/16/2014   HCT 48.1 05/16/2014   MCV 93.3 05/16/2014   PLT 212 05/16/2014   NEUTROABS 6.2 05/16/2014      Chemistry      Component Value Date/Time   NA 136 04/02/2014 1000   NA 139 10/30/2013 1447   K 4.5 04/02/2014 1000   K 4.0 10/30/2013 1447   CL 103 04/02/2014 1000   CO2 27 04/02/2014 1000   CO2 24 10/30/2013 1447   BUN 18 04/02/2014 1000   BUN 18.8 10/30/2013 1447   CREATININE 1.0 04/02/2014 1000   CREATININE 1.2 10/30/2013 1447      Component Value Date/Time   CALCIUM 9.4 04/02/2014 1000   CALCIUM 9.3 10/30/2013 1447   ALKPHOS 59 10/30/2013 1447   ALKPHOS 52 10/03/2012 0930   AST 24 10/30/2013 1447   AST 27 10/03/2012 0930   ALT 20 10/30/2013 1447   ALT 23 10/03/2012 0930   BILITOT 0.64 10/30/2013 1447   BILITOT 1.4* 10/03/2012 0930       Basic Metabolic Panel: No results found for this basename: NA, K, CL, CO2, GLUCOSE, BUN, CREATININE, CALCIUM, MG, PHOS,  in the last 168 hours GFR Estimated Creatinine Clearance: 73.2 ml/min (by C-G formula based on Cr of 1). Liver Function Tests: No results found for this basename: AST, ALT, ALKPHOS, BILITOT, PROT, ALBUMIN,  in the last 168 hours No results found for this basename: LIPASE, AMYLASE,  in the last 168 hours No results found for this basename: AMMONIA,  in the last 168 hours Coagulation profile No results found for this basename: INR, PROTIME,  in the last 168 hours  CBC:  Recent Labs Lab 05/16/14 0820  WBC 11.7*  NEUTROABS 6.2  HGB 16.0  HCT 48.1  MCV 93.3  PLT 212    Anemia work up No results found for this basename: VITAMINB12, FOLATE, FERRITIN, TIBC, IRON, RETICCTPCT,  in the last 72 hours  Studies:  No results found.   RADIOGRAPHIC STUDIES: No results found.  ASSESSMENT: Larry Huang 68 y.o. male  with a history of CHRONIC LYMPHOID LEUKEMIA W/O ACHIEVED REMISSION   PLAN:   1. CLL. --Mr. Fortune remains with asymptomatic stage 0 chronic  lymphocytic leukemia. His CBC is virtually normal and his prognosis is excellent. We will plan to see Mr. Soley again in 4-6 moths, at which time we will check CBC and chemistries.   All questions were answered. The patient knows to call the clinic with any problems, questions or concerns. We can certainly see the patient much sooner if necessary.  I spent 10 minutes counseling the patient face to face. The total time spent in the appointment was 15 minutes.    Latica Hohmann, MD 05/16/2014 8:54 AM

## 2014-06-28 ENCOUNTER — Ambulatory Visit (INDEPENDENT_AMBULATORY_CARE_PROVIDER_SITE_OTHER): Payer: Medicare PPO

## 2014-06-28 DIAGNOSIS — Z23 Encounter for immunization: Secondary | ICD-10-CM

## 2014-07-27 ENCOUNTER — Other Ambulatory Visit: Payer: Self-pay | Admitting: Internal Medicine

## 2014-09-23 ENCOUNTER — Encounter: Payer: Self-pay | Admitting: Internal Medicine

## 2014-09-23 ENCOUNTER — Ambulatory Visit (INDEPENDENT_AMBULATORY_CARE_PROVIDER_SITE_OTHER): Payer: Medicare PPO | Admitting: Internal Medicine

## 2014-09-23 VITALS — BP 128/84 | HR 63 | Temp 97.9°F | Ht 71.0 in | Wt 176.0 lb

## 2014-09-23 DIAGNOSIS — J209 Acute bronchitis, unspecified: Secondary | ICD-10-CM | POA: Insufficient documentation

## 2014-09-23 DIAGNOSIS — I10 Essential (primary) hypertension: Secondary | ICD-10-CM

## 2014-09-23 DIAGNOSIS — J202 Acute bronchitis due to streptococcus: Secondary | ICD-10-CM

## 2014-09-23 MED ORDER — AMOXICILLIN 875 MG PO TABS: 875.0000 mg | ORAL_TABLET | Freq: Two times a day (BID) | ORAL | Status: DC

## 2014-09-23 MED ORDER — HYDROCODONE-HOMATROPINE 5-1.5 MG/5ML PO SYRP: 5.0000 mL | ORAL_SOLUTION | Freq: Three times a day (TID) | ORAL | Status: DC | PRN

## 2014-09-23 NOTE — Progress Notes (Signed)
   Subjective:    Patient ID: Larry Huang, male    DOB: 1946/07/07, 68 y.o.   MRN: [id number]  Cough This is a new problem. The current episode started in the past 7 days. The problem has been unchanged. The problem occurs every few hours. The cough is productive of purulent sputum. Associated symptoms include chills. Pertinent negatives include no chest pain, ear congestion, ear pain, fever, headaches, heartburn, hemoptysis, myalgias, nasal congestion, postnasal drip, rash, rhinorrhea, sore throat, shortness of breath, sweats, weight loss or wheezing. He has tried OTC cough suppressant for the symptoms. The treatment provided mild relief. There is no history of asthma, bronchiectasis, bronchitis, COPD, emphysema, environmental allergies or pneumonia.      Review of Systems  Constitutional: Positive for chills. Negative for fever, weight loss and diaphoresis.  HENT: Negative.  Negative for ear pain, postnasal drip, rhinorrhea and sore throat.   Eyes: Negative.   Respiratory: Positive for cough. Negative for hemoptysis, choking, chest tightness, shortness of breath and wheezing.   Cardiovascular: Negative.  Negative for chest pain and leg swelling.  Gastrointestinal: Negative.  Negative for heartburn, nausea, vomiting, abdominal pain, diarrhea, constipation and blood in stool.  Endocrine: Negative.   Genitourinary: Negative.   Musculoskeletal: Negative.  Negative for myalgias.  Skin: Negative.  Negative for rash.  Allergic/Immunologic: Negative.  Negative for environmental allergies.  Neurological: Negative.  Negative for headaches.  Hematological: Negative.  Negative for adenopathy. Does not bruise/bleed easily.  Psychiatric/Behavioral: Negative.        Objective:   Physical Exam  Constitutional: He is oriented to person, place, and time. He appears well-developed and well-nourished. No distress.  HENT:  Head: Normocephalic and atraumatic.  Mouth/Throat: Oropharynx is clear and  moist. No oropharyngeal exudate.  Eyes: Conjunctivae are normal. Right eye exhibits no discharge. Left eye exhibits no discharge. No scleral icterus.  Neck: Normal range of motion. Neck supple. No JVD present. No tracheal deviation present. No thyromegaly present.  Cardiovascular: Normal rate, regular rhythm, normal heart sounds and intact distal pulses.  Exam reveals no gallop.   No murmur heard. Pulmonary/Chest: Effort normal and breath sounds normal. No stridor. No respiratory distress. He has no wheezes. He has no rales. He exhibits no tenderness.  Abdominal: Soft. Bowel sounds are normal. He exhibits no distension and no mass. There is no tenderness. There is no rebound and no guarding.  Musculoskeletal: Normal range of motion. He exhibits no edema or tenderness.  Lymphadenopathy:    He has no cervical adenopathy.  Neurological: He is oriented to person, place, and time.  Skin: Skin is warm and dry. No rash noted. He is not diaphoretic. No erythema. No pallor.  Psychiatric: He has a normal mood and affect. His behavior is normal. Judgment and thought content normal.  Vitals reviewed.    Lab Results  Component Value Date   WBC 11.7* 05/16/2014   HGB 16.0 05/16/2014   HCT 48.1 05/16/2014   PLT 212 05/16/2014   GLUCOSE 81 05/16/2014   CHOL 263* 04/02/2014   TRIG 264.0* 04/02/2014   HDL 40.20 04/02/2014   LDLCALC 170* 04/02/2014   ALT 19 05/16/2014   AST 24 05/16/2014   NA 140 05/16/2014   K 4.4 05/16/2014   CL 103 04/02/2014   CREATININE 1.1 05/16/2014   BUN 18.3 05/16/2014   CO2 25 05/16/2014   TSH 1.35 10/03/2012   PSA 0.77 04/02/2014       Assessment & Plan:

## 2014-09-23 NOTE — Patient Instructions (Signed)

## 2014-09-24 NOTE — Assessment & Plan Note (Signed)
Will treat the infection with amoxil, will control the cough with hycodan

## 2014-09-24 NOTE — Assessment & Plan Note (Signed)
His BP is well controlled 

## 2014-11-25 DIAGNOSIS — H43813 Vitreous degeneration, bilateral: Secondary | ICD-10-CM | POA: Diagnosis not present

## 2015-04-24 ENCOUNTER — Telehealth: Payer: Self-pay | Admitting: Hematology and Oncology

## 2015-04-24 NOTE — Telephone Encounter (Signed)
Called and left a message with new dr Alvy Bimler appointment

## 2015-05-13 IMAGING — CR DG CHEST 2V
2 series · 2 of 2 positions shown · non-contrast
Comparison: October 03, 2009.

CLINICAL DATA: Cough and hoarseness and symptoms of acute
bronchitis

EXAM:
CHEST  2 VIEW

[view not recorded (1 of 2)]
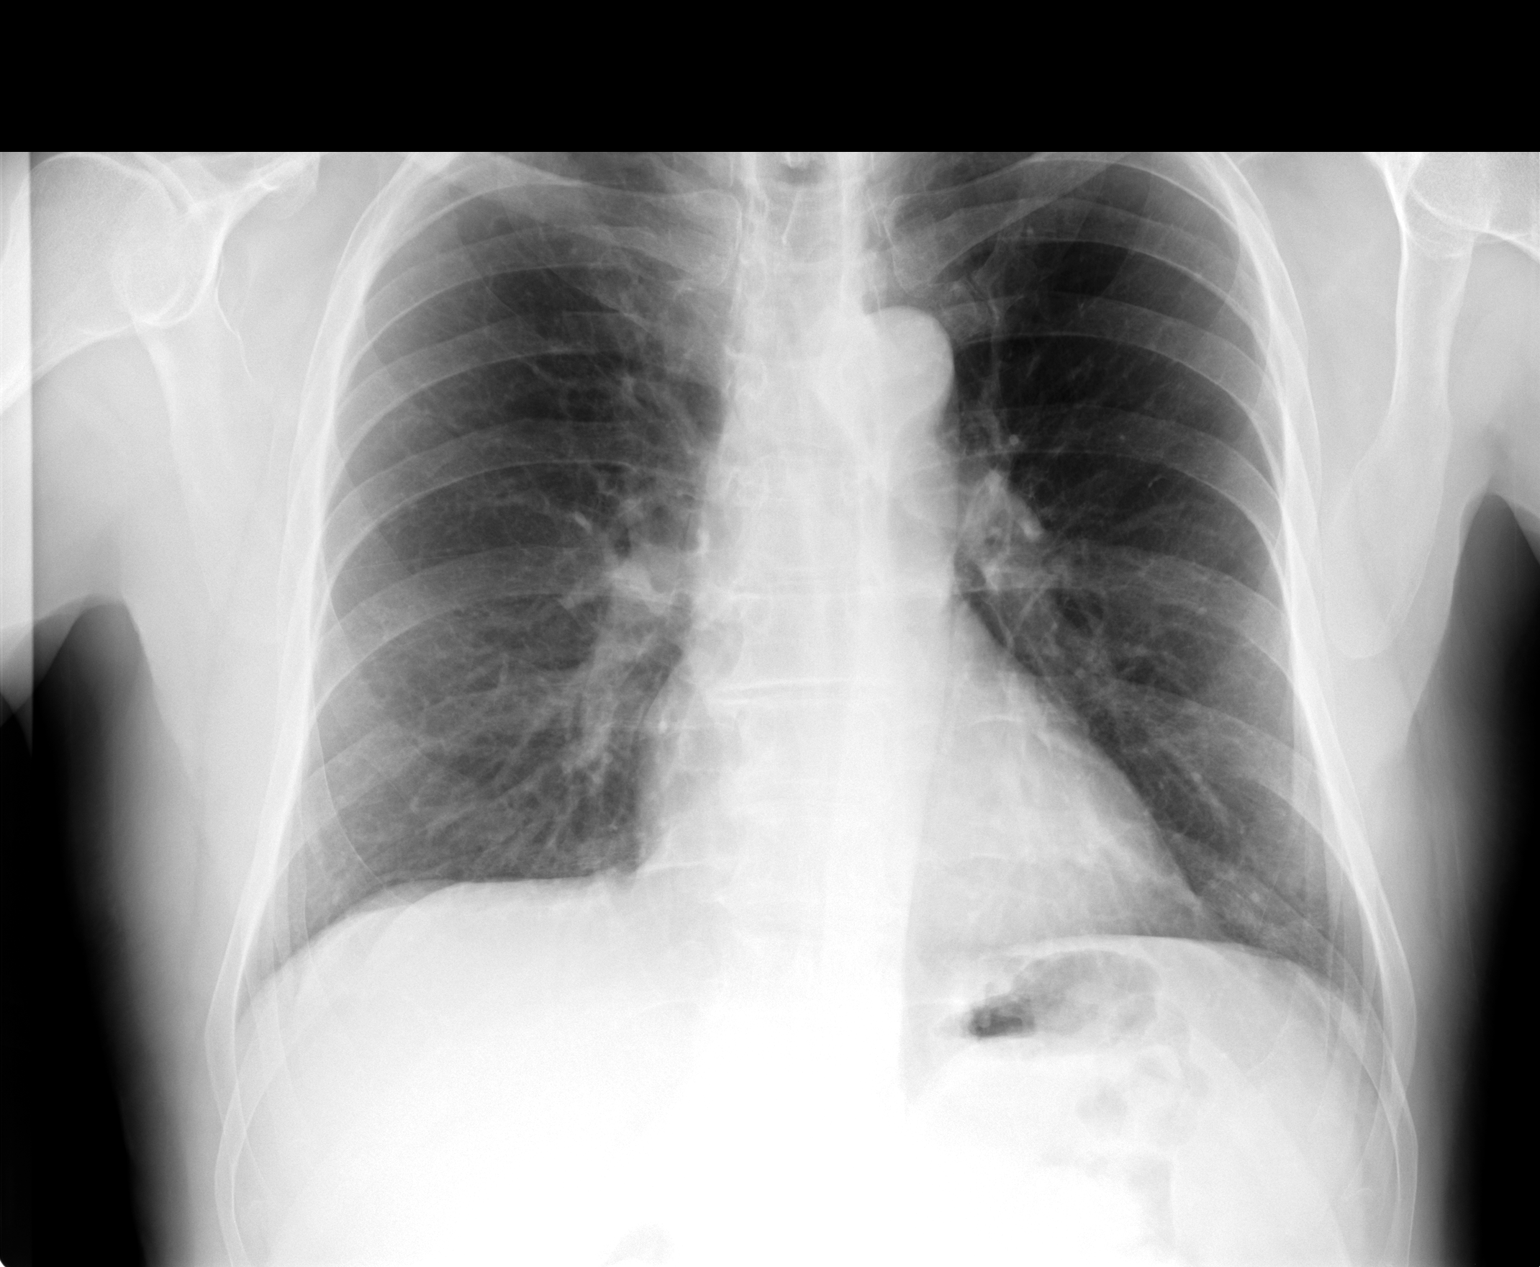

[view not recorded (2 of 2)]
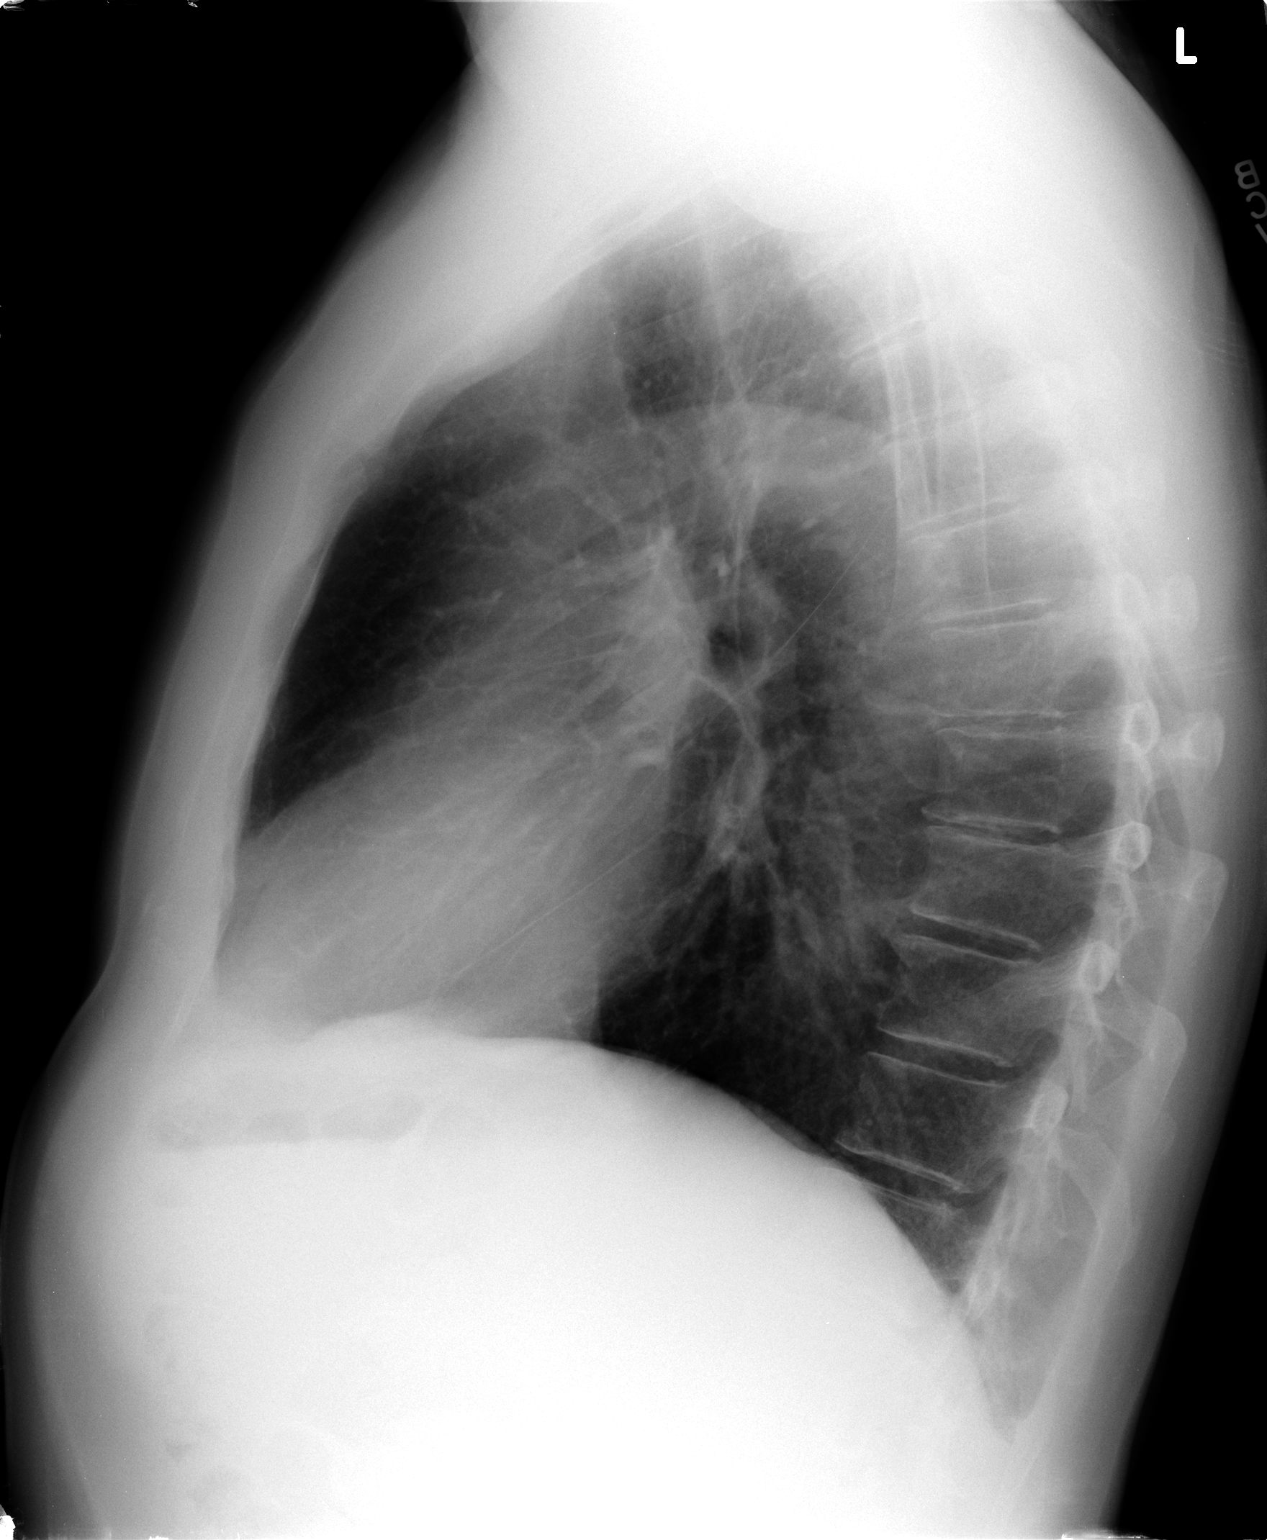

[2 of 2 positions shown; findings below may reference images not displayed]

FINDINGS: The lungs are well-expanded and clear. The cardiac silhouette is
normal in size and contour. There is abnormal mediastinal width.
There is no pleural effusion or pneumothorax or pneumomediastinum.
The bony thorax is normal in appearance.
IMPRESSION: There is no evidence of active cardiopulmonary disease.

## 2015-05-14 ENCOUNTER — Other Ambulatory Visit: Payer: Self-pay | Admitting: Hematology and Oncology

## 2015-05-14 DIAGNOSIS — C9110 Chronic lymphocytic leukemia of B-cell type not having achieved remission: Secondary | ICD-10-CM

## 2015-05-15 ENCOUNTER — Other Ambulatory Visit: Payer: Medicare PPO

## 2015-05-15 ENCOUNTER — Other Ambulatory Visit (HOSPITAL_BASED_OUTPATIENT_CLINIC_OR_DEPARTMENT_OTHER): Payer: Medicare PPO

## 2015-05-15 ENCOUNTER — Telehealth: Payer: Self-pay | Admitting: Hematology and Oncology

## 2015-05-15 ENCOUNTER — Ambulatory Visit: Payer: Medicare PPO

## 2015-05-15 ENCOUNTER — Encounter: Payer: Self-pay | Admitting: Hematology and Oncology

## 2015-05-15 ENCOUNTER — Ambulatory Visit (HOSPITAL_BASED_OUTPATIENT_CLINIC_OR_DEPARTMENT_OTHER): Payer: Medicare PPO | Admitting: Hematology and Oncology

## 2015-05-15 VITALS — BP 128/60 | HR 59 | Temp 98.0°F | Resp 18 | Ht 71.0 in | Wt 173.3 lb

## 2015-05-15 DIAGNOSIS — D72820 Lymphocytosis (symptomatic): Secondary | ICD-10-CM | POA: Diagnosis not present

## 2015-05-15 DIAGNOSIS — C9110 Chronic lymphocytic leukemia of B-cell type not having achieved remission: Secondary | ICD-10-CM

## 2015-05-15 HISTORY — DX: Lymphocytosis (symptomatic): D72.820

## 2015-05-15 LAB — CBC WITH DIFFERENTIAL/PLATELET
BASO%: 0.3 % (ref 0.0–2.0)
Basophils Absolute: 0 10*3/uL (ref 0.0–0.1)
EOS ABS: 0.3 10*3/uL (ref 0.0–0.5)
EOS%: 2.7 % (ref 0.0–7.0)
HCT: 46.5 % (ref 38.4–49.9)
HGB: 15.6 g/dL (ref 13.0–17.1)
LYMPH%: 47.7 % (ref 14.0–49.0)
MCH: 31.6 pg (ref 27.2–33.4)
MCHC: 33.6 g/dL (ref 32.0–36.0)
MCV: 94 fL (ref 79.3–98.0)
MONO#: 0.6 10*3/uL (ref 0.1–0.9)
MONO%: 6.4 % (ref 0.0–14.0)
NEUT%: 42.9 % (ref 39.0–75.0)
NEUTROS ABS: 4.2 10*3/uL (ref 1.5–6.5)
PLATELETS: 233 10*3/uL (ref 140–400)
RBC: 4.95 10*6/uL (ref 4.20–5.82)
RDW: 13.7 % (ref 11.0–14.6)
WBC: 9.8 10*3/uL (ref 4.0–10.3)
lymph#: 4.7 10*3/uL — ABNORMAL HIGH (ref 0.9–3.3)

## 2015-05-15 NOTE — Progress Notes (Signed)
[facility] FOLLOW-UP progress notes  Patient Care Team: Janith Lima, MD as PCP - General Heath Lark, MD as Consulting Physician (Hematology and Oncology)  CHIEF COMPLAINTS/PURPOSE OF VISIT:  MBUS  HISTORY OF PRESENTING ILLNESS:  Larry Huang 69 y.o. male was transferred to my care after his prior physician has left.  I reviewed the patient's records extensive and collaborated the history with the patient. Summary of his history is as follows: This patient was diagnosed with CLL 6 years ago after he was found to have mild lymphocytosis. The absolute lymphocyte count from 2009 to present showed he had elevated absolute lymphocyte count usually between 4-5 but not consistently over 5. He was told he has CLL and was placed on observation.  Flow cytometry in November 2009 confirmed monoclonal B-cell population He denies recent infection. Denies new lymphadenopathy  MEDICAL HISTORY:  Past Medical History  Diagnosis Date  . Hyperlipidemia   . HTN (hypertension)   . CLL (chronic lymphoblastic leukemia)   . Contact lens/glasses fitting     wears contacts or glasses  . Snores   . GERD (gastroesophageal reflux disease)   . Monoclonal B-cell lymphocytosis 05/15/2015    SURGICAL HISTORY: Past Surgical History  Procedure Laterality Date  . Colonoscopy    . Tonsillectomy    . Microlaryngoscopy with co2 laser and excision of vocal cord lesion Right 09/04/2013    Procedure: MICROLARYNGOSCOPY WITH EXCISION OF VOCAL CORD LESION;  Surgeon: Rozetta Nunnery, MD;  Location: [facility];  Service: ENT;  Laterality: Right;    SOCIAL HISTORY: Social History   Social History  . Marital Status: Married    Spouse Name: N/A  . Number of Children: N/A  . Years of Education: N/A   Occupational History  . Facilities Management at [employer] History Main Topics  . Smoking status: Never Smoker   . Smokeless tobacco: Never Used  . Alcohol Use:  No     Comment: rare  . Drug Use: No  . Sexual Activity: Yes   Other Topics Concern  . Not on file   Social History Narrative   Regular Exercise -  YES    FAMILY HISTORY: Family History  Problem Relation Age of Onset  . Lung cancer Other   . Hypertension Other   . Cancer Other     Lung Cancer  . Cancer Father     lung ca    ALLERGIES:  is allergic to benazepril and lipitor.  MEDICATIONS:  Current Outpatient Prescriptions  Medication Sig Dispense Refill  . amLODipine (NORVASC) 5 MG tablet TAKE 1 TABLET (5 MG TOTAL) BY MOUTH DAILY. 90 tablet 3  . aspirin EC 81 MG tablet Take 81 mg by mouth daily.       No current facility-administered medications for this visit.    REVIEW OF SYSTEMS:   Constitutional: Denies fevers, chills or abnormal night sweats Eyes: Denies blurriness of vision, double vision or watery eyes Ears, nose, mouth, throat, and face: Denies mucositis or sore throat Respiratory: Denies cough, dyspnea or wheezes Cardiovascular: Denies palpitation, chest discomfort or lower extremity swelling Gastrointestinal:  Denies nausea, heartburn or change in bowel habits Skin: Denies abnormal skin rashes Lymphatics: Denies new lymphadenopathy or easy bruising Neurological:Denies numbness, tingling or new weaknesses Behavioral/Psych: Mood is stable, no new changes  All other systems were reviewed with the patient and are negative.  PHYSICAL EXAMINATION: ECOG PERFORMANCE STATUS: 0 - Asymptomatic  Filed Vitals:  05/15/15 1200  BP: 128/60  Pulse: 59  Temp: 98 F (36.7 C)  Resp: 18   Filed Weights   05/15/15 1200  Weight: 173 lb 4.8 oz (78.608 kg)    GENERAL:alert, no distress and comfortable SKIN: skin color, texture, turgor are normal, no rashes or significant lesions EYES: normal, conjunctiva are pink and non-injected, sclera clear OROPHARYNX:no exudate, normal lips, buccal mucosa, and tongue  NECK: supple, thyroid normal size, non-tender, without  nodularity LYMPH:  no palpable lymphadenopathy in the cervical, axillary or inguinal LUNGS: clear to auscultation and percussion with normal breathing effort HEART: regular rate & rhythm and no murmurs without lower extremity edema ABDOMEN:abdomen soft, non-tender and normal bowel sounds Musculoskeletal:no cyanosis of digits and no clubbing  PSYCH: alert & oriented x 3 with fluent speech NEURO: no focal motor/sensory deficits  LABORATORY DATA:  I have reviewed the data as listed Lab Results  Component Value Date   WBC 9.8 05/15/2015   HGB 15.6 05/15/2015   HCT 46.5 05/15/2015   MCV 94.0 05/15/2015   PLT 233 05/15/2015    Recent Labs  05/16/14 0820  NA 140  K 4.4  CO2 25  GLUCOSE 81  BUN 18.3  CREATININE 1.1  CALCIUM 9.2  PROT 6.7  ALBUMIN 3.8  AST 24  ALT 19  ALKPHOS 60  BILITOT 1.06    ASSESSMENT & PLAN:  Monoclonal B-cell lymphocytosis The patient was informed he had CLL. Looking at the trend of the absolute lymphocyte count, the more appropriate term would be monoclonal B-cell lymphocytosis of unknown significance I recommend observation only. We discussed the importance of preventive vaccination programs   Orders Placed This Encounter  Procedures  . CBC with Differential/Platelet    Standing Status: Future     Number of Occurrences:      Standing Expiration Date: 06/18/2016    All questions were answered. The patient knows to call the clinic with any problems, questions or concerns. I spent 15 minutes counseling the patient face to face. The total time spent in the appointment was 20 minutes and more than 50% was on counseling.     [facility], [place], MD 05/15/2015 12:52 PM

## 2015-05-15 NOTE — Assessment & Plan Note (Signed)
The patient was informed he had CLL. Looking at the trend of the absolute lymphocyte count, the more appropriate term would be monoclonal B-cell lymphocytosis of unknown significance I recommend observation only. We discussed the importance of preventive vaccination programs

## 2015-05-15 NOTE — Telephone Encounter (Signed)
Appointments made and avs printed for patient °

## 2015-05-28 ENCOUNTER — Encounter: Payer: Self-pay | Admitting: Gastroenterology

## 2015-07-30 ENCOUNTER — Other Ambulatory Visit: Payer: Self-pay

## 2015-07-30 MED ORDER — AMLODIPINE BESYLATE 5 MG PO TABS: ORAL_TABLET | ORAL | Status: DC

## 2015-08-05 ENCOUNTER — Other Ambulatory Visit (INDEPENDENT_AMBULATORY_CARE_PROVIDER_SITE_OTHER): Payer: Medicare PPO

## 2015-08-05 ENCOUNTER — Ambulatory Visit (INDEPENDENT_AMBULATORY_CARE_PROVIDER_SITE_OTHER): Payer: Medicare PPO | Admitting: Internal Medicine

## 2015-08-05 ENCOUNTER — Encounter: Payer: Self-pay | Admitting: Internal Medicine

## 2015-08-05 VITALS — BP 142/82 | HR 60 | Temp 97.3°F | Resp 16 | Ht 71.0 in | Wt 177.0 lb

## 2015-08-05 DIAGNOSIS — I10 Essential (primary) hypertension: Secondary | ICD-10-CM

## 2015-08-05 DIAGNOSIS — E785 Hyperlipidemia, unspecified: Secondary | ICD-10-CM | POA: Diagnosis not present

## 2015-08-05 DIAGNOSIS — Z23 Encounter for immunization: Secondary | ICD-10-CM

## 2015-08-05 DIAGNOSIS — N400 Enlarged prostate without lower urinary tract symptoms: Secondary | ICD-10-CM

## 2015-08-05 DIAGNOSIS — Z0000 Encounter for general adult medical examination without abnormal findings: Secondary | ICD-10-CM | POA: Diagnosis not present

## 2015-08-05 LAB — FECAL OCCULT BLOOD, GUAIAC: Fecal Occult Blood: NEGATIVE

## 2015-08-05 LAB — COMPREHENSIVE METABOLIC PANEL
ALT: 16 U/L (ref 0–53)
AST: 20 U/L (ref 0–37)
Albumin: 4.4 g/dL (ref 3.5–5.2)
Alkaline Phosphatase: 57 U/L (ref 39–117)
BUN: 17 mg/dL (ref 6–23)
CHLORIDE: 105 meq/L (ref 96–112)
CO2: 28 meq/L (ref 19–32)
CREATININE: 1.02 mg/dL (ref 0.40–1.50)
Calcium: 9.4 mg/dL (ref 8.4–10.5)
GFR: 76.4 mL/min (ref >60.00)
GLUCOSE: 90 mg/dL (ref 70–99)
Potassium: 4.4 mEq/L (ref 3.5–5.1)
SODIUM: 141 meq/L (ref 135–145)
Total Bilirubin: 1 mg/dL (ref 0.2–1.2)
Total Protein: 6.8 g/dL (ref 6.0–8.3)

## 2015-08-05 LAB — LIPID PANEL
CHOL/HDL RATIO: 6
Cholesterol: 243 mg/dL — ABNORMAL HIGH (ref 0–200)
HDL: 38.5 mg/dL — ABNORMAL LOW (ref >39.00)
LDL CALC: 175 mg/dL — AB (ref 0–99)
NonHDL: 204.89
Triglycerides: 150 mg/dL — ABNORMAL HIGH (ref 0.0–149.0)
VLDL: 30 mg/dL (ref 0.0–40.0)

## 2015-08-05 LAB — TSH: TSH: 1.46 u[IU]/mL (ref 0.35–4.50)

## 2015-08-05 LAB — PSA: PSA: 0.81 ng/mL (ref 0.10–4.00)

## 2015-08-05 MED ORDER — AMLODIPINE BESYLATE 5 MG PO TABS: ORAL_TABLET | ORAL | Status: DC

## 2015-08-05 MED ORDER — PRAVASTATIN SODIUM 40 MG PO TABS: 40.0000 mg | ORAL_TABLET | Freq: Every day | ORAL | Status: DC

## 2015-08-05 NOTE — Progress Notes (Signed)
Pre visit review using our clinic review tool, if applicable. No additional management support is needed unless otherwise documented below in the visit note. 

## 2015-08-05 NOTE — Progress Notes (Signed)
Subjective:  Patient ID: Larry Huang, male    DOB: Mar 01, 1946  Age: 69 y.o. MRN: [id number]  CC: Hypertension; Hyperlipidemia; and Annual Exam   HPI Larry Huang presents for a physical. He feels well and offers no complaints.  Outpatient Prescriptions Prior to Visit  Medication Sig Dispense Refill  . aspirin EC 81 MG tablet Take 81 mg by mouth daily.      Marland Kitchen amLODipine (NORVASC) 5 MG tablet TAKE 1 TABLET (5 MG TOTAL) BY MOUTH DAILY. 90 tablet 0   No facility-administered medications prior to visit.    ROS Review of Systems  Constitutional: Negative.  Negative for fever, chills, diaphoresis, appetite change and fatigue.  HENT: Negative.   Eyes: Negative.   Respiratory: Negative.  Negative for cough, choking, chest tightness, shortness of breath and stridor.   Cardiovascular: Negative.  Negative for chest pain, palpitations and leg swelling.  Gastrointestinal: Negative.  Negative for nausea, vomiting, abdominal pain, diarrhea and constipation.  Endocrine: Negative.   Genitourinary: Negative.  Negative for dysuria, urgency, frequency, hematuria and difficulty urinating.  Musculoskeletal: Negative.  Negative for myalgias, back pain, arthralgias and neck pain.  Skin: Negative.  Negative for rash.  Allergic/Immunologic: Negative.   Neurological: Negative.  Negative for dizziness, tremors, weakness, light-headedness, numbness and headaches.  Hematological: Negative.  Negative for adenopathy. Does not bruise/bleed easily.  Psychiatric/Behavioral: Negative.     Objective:  BP 142/82 mmHg  Pulse 60  Temp(Src) 97.3 F (36.3 C) (Oral)  Resp 16  Ht 5\' 11"  (1.803 m)  Wt 177 lb (80.287 kg)  BMI 24.70 kg/m2  SpO2 97%  BP Readings from Last 3 Encounters:  08/05/15 142/82  05/15/15 128/60  09/23/14 128/84    Wt Readings from Last 3 Encounters:  08/05/15 177 lb (80.287 kg)  05/15/15 173 lb 4.8 oz (78.608 kg)  09/23/14 176 lb (79.833 kg)    Physical Exam  Constitutional:  He appears well-developed and well-nourished. No distress.  HENT:  Head: Normocephalic and atraumatic.  Mouth/Throat: Oropharynx is clear and moist. No oropharyngeal exudate.  Eyes: Conjunctivae are normal. Right eye exhibits no discharge. Left eye exhibits no discharge. No scleral icterus.  Neck: Normal range of motion. Neck supple. No JVD present. No tracheal deviation present. No thyromegaly present.  Cardiovascular: Normal rate, regular rhythm, normal heart sounds and intact distal pulses.  Exam reveals no gallop and no friction rub.   No murmur heard. Pulmonary/Chest: Effort normal and breath sounds normal. No stridor. No respiratory distress. He has no wheezes. He has no rales. He exhibits no tenderness.  Abdominal: Soft. Bowel sounds are normal. He exhibits no distension and no mass. There is no tenderness. There is no rebound and no guarding. Hernia confirmed negative in the right inguinal area and confirmed negative in the left inguinal area.  Genitourinary: Rectum normal, testes normal and penis normal. Rectal exam shows no external hemorrhoid, no internal hemorrhoid, no fissure, no mass, no tenderness and anal tone normal. Guaiac negative stool. Prostate is enlarged (1+ smooth symm BPH). Prostate is not tender. Right testis shows no mass, no swelling and no tenderness. Right testis is descended. Left testis shows no mass, no swelling and no tenderness. Left testis is descended. Circumcised. No penile erythema or penile tenderness. No discharge found.  Lymphadenopathy:    He has no cervical adenopathy.       Right: No inguinal adenopathy present.       Left: No inguinal adenopathy present.  Skin: He is not diaphoretic.  Vitals reviewed.   Lab Results  Component Value Date   WBC 9.8 05/15/2015   HGB 15.6 05/15/2015   HCT 46.5 05/15/2015   PLT 233 05/15/2015   GLUCOSE 90 08/05/2015   CHOL 243* 08/05/2015   TRIG 150.0* 08/05/2015   HDL 38.50* 08/05/2015   LDLCALC 175* 08/05/2015     ALT 16 08/05/2015   AST 20 08/05/2015   NA 141 08/05/2015   K 4.4 08/05/2015   CL 105 08/05/2015   CREATININE 1.02 08/05/2015   BUN 17 08/05/2015   CO2 28 08/05/2015   TSH 1.46 08/05/2015   PSA 0.81 08/05/2015    No results found.  Assessment & Plan:   Larry Huang was seen today for hypertension, hyperlipidemia and annual exam.  Diagnoses and all orders for this visit:  Essential hypertension, benign- his blood pressure is well-controlled, electrolytes and renal function are stable. -     amLODipine (NORVASC) 5 MG tablet; TAKE 1 TABLET (5 MG TOTAL) BY MOUTH DAILY. -     Comprehensive metabolic panel; Future  BPH (benign prostatic hyperplasia)- he has no signs or symptoms that need to be treated, his exam and PSA are not suspicious for prostate cancer, will continue to follow. -     PSA; Future  Hyperlipidemia with target LDL less than 130- his Framingham risk score is greater than 30%. He previously had muscle aches from atorvastatin. Will start pravastatin for primary risk reduction. -     Lipid panel; Future -     Comprehensive metabolic panel; Future -     TSH; Future -     pravastatin (PRAVACHOL) 40 MG tablet; Take 1 tablet (40 mg total) by mouth daily.  Need for vaccination with 13-polyvalent pneumococcal conjugate vaccine -     Pneumococcal conjugate vaccine 13-valent  Other orders -     Flu Vaccine QUAD 36+ mos IM  I am having Larry Huang start on pravastatin. I am also having him maintain his aspirin EC and amLODipine.  Meds ordered this encounter  Medications  . amLODipine (NORVASC) 5 MG tablet    Sig: TAKE 1 TABLET (5 MG TOTAL) BY MOUTH DAILY.    Dispense:  90 tablet    Refill:  2  . pravastatin (PRAVACHOL) 40 MG tablet    Sig: Take 1 tablet (40 mg total) by mouth daily.    Dispense:  90 tablet    Refill:  3   See AVS for instructions about healthy living and anticipatory guidance.  Follow-up: Return if symptoms worsen or fail to improve.  [name],  MD

## 2015-08-05 NOTE — Patient Instructions (Signed)

## 2015-08-11 NOTE — Assessment & Plan Note (Signed)

## 2016-05-13 ENCOUNTER — Ambulatory Visit (HOSPITAL_BASED_OUTPATIENT_CLINIC_OR_DEPARTMENT_OTHER): Payer: Medicare Other | Admitting: Hematology and Oncology

## 2016-05-13 ENCOUNTER — Other Ambulatory Visit (HOSPITAL_BASED_OUTPATIENT_CLINIC_OR_DEPARTMENT_OTHER): Payer: Medicare Other

## 2016-05-13 ENCOUNTER — Encounter: Payer: Self-pay | Admitting: Hematology and Oncology

## 2016-05-13 DIAGNOSIS — D72820 Lymphocytosis (symptomatic): Secondary | ICD-10-CM

## 2016-05-13 LAB — CBC WITH DIFFERENTIAL/PLATELET
BASO%: 0.3 % (ref 0.0–2.0)
Basophils Absolute: 0 10*3/uL (ref 0.0–0.1)
EOS ABS: 0.3 10*3/uL (ref 0.0–0.5)
EOS%: 3.6 % (ref 0.0–7.0)
HCT: 47.4 % (ref 38.4–49.9)
HGB: 15.6 g/dL (ref 13.0–17.1)
LYMPH%: 40.8 % (ref 14.0–49.0)
MCH: 31.2 pg (ref 27.2–33.4)
MCHC: 33 g/dL (ref 32.0–36.0)
MCV: 94.5 fL (ref 79.3–98.0)
MONO#: 0.6 10*3/uL (ref 0.1–0.9)
MONO%: 7.3 % (ref 0.0–14.0)
NEUT%: 48 % (ref 39.0–75.0)
NEUTROS ABS: 4 10*3/uL (ref 1.5–6.5)
PLATELETS: 205 10*3/uL (ref 140–400)
RBC: 5.01 10*6/uL (ref 4.20–5.82)
RDW: 13.5 % (ref 11.0–14.6)
WBC: 8.4 10*3/uL (ref 4.0–10.3)
lymph#: 3.4 10*3/uL — ABNORMAL HIGH (ref 0.9–3.3)

## 2016-05-13 NOTE — Assessment & Plan Note (Signed)
The patient was informed he had CLL. Looking at the trend of the absolute lymphocyte count, the more appropriate term would be monoclonal B-cell lymphocytosis of unknown significance I recommend observation only. The risk of progression to CLL is only 1% per year His absolute lymphocyte count has been under 5 for the last 3 years. I recommend follow-up with primary care doctor only with repeat CBC with differential on an annual basis. If absolute lymphocyte count is greater than 5, the patient will call me and I will resume follow-up here in the hematology clinic

## 2016-05-13 NOTE — Progress Notes (Signed)
[facility] OFFICE PROGRESS NOTE  Patient Care Team: Janith Lima, MD as PCP - General Heath Lark, MD as Consulting Physician (Hematology and Oncology)  SUMMARY OF ONCOLOGIC HISTORY:  Larry Huang was transferred to my care after his prior physician has left.  I reviewed the patient's records extensive and collaborated the history with the patient. Summary of his history is as follows: This patient was diagnosed with CLL 6 years ago after he was found to have mild lymphocytosis. The absolute lymphocyte count from 2009 to present showed he had elevated absolute lymphocyte count usually between 4-5 but not consistently over 5. He was told he has CLL and was placed on observation.   Flow cytometry in November 2009 confirmed monoclonal B-cell population INTERVAL HISTORY: Please see below for problem oriented charting. He denies recent infection. Denies new lymphadenopathy He denies anorexia, abnormal weight loss or night sweats  REVIEW OF SYSTEMS:   Constitutional: Denies fevers, chills or abnormal weight loss Eyes: Denies blurriness of vision Ears, nose, mouth, throat, and face: Denies mucositis or sore throat Respiratory: Denies cough, dyspnea or wheezes Cardiovascular: Denies palpitation, chest discomfort or lower extremity swelling Gastrointestinal:  Denies nausea, heartburn or change in bowel habits Skin: Denies abnormal skin rashes Lymphatics: Denies new lymphadenopathy or easy bruising Neurological:Denies numbness, tingling or new weaknesses Behavioral/Psych: Mood is stable, no new changes  All other systems were reviewed with the patient and are negative.  I have reviewed the past medical history, past surgical history, social history and family history with the patient and they are unchanged from previous note.  ALLERGIES:  is allergic to benazepril and lipitor [atorvastatin].  MEDICATIONS:  Current Outpatient Prescriptions  Medication Sig Dispense Refill   . amLODipine (NORVASC) 5 MG tablet TAKE 1 TABLET (5 MG TOTAL) BY MOUTH DAILY. 90 tablet 2  . aspirin EC 81 MG tablet Take 81 mg by mouth daily.      . pravastatin (PRAVACHOL) 40 MG tablet Take 1 tablet (40 mg total) by mouth daily. 90 tablet 3   No current facility-administered medications for this visit.     PHYSICAL EXAMINATION: ECOG PERFORMANCE STATUS: 0 - Asymptomatic  Vitals:   05/13/16 1034  BP: 133/70  Pulse: (!) 57  Resp: 18  Temp: 98.2 F (36.8 C)   Filed Weights   05/13/16 1034  Weight: 177 lb 4.8 oz (80.4 kg)    GENERAL:alert, no distress and comfortable SKIN: skin color, texture, turgor are normal, no rashes or significant lesions EYES: normal, Conjunctiva are pink and non-injected, sclera clear Musculoskeletal:no cyanosis of digits and no clubbing  NEURO: alert & oriented x 3 with fluent speech, no focal motor/sensory deficits  LABORATORY DATA:  I have reviewed the data as listed    Component Value Date/Time   NA 141 08/05/2015 0907   NA 140 05/16/2014 0820   K 4.4 08/05/2015 0907   K 4.4 05/16/2014 0820   CL 105 08/05/2015 0907   CO2 28 08/05/2015 0907   CO2 25 05/16/2014 0820   GLUCOSE 90 08/05/2015 0907   GLUCOSE 81 05/16/2014 0820   BUN 17 08/05/2015 0907   BUN 18.3 05/16/2014 0820   CREATININE 1.02 08/05/2015 0907   CREATININE 1.1 05/16/2014 0820   CALCIUM 9.4 08/05/2015 0907   CALCIUM 9.2 05/16/2014 0820   PROT 6.8 08/05/2015 0907   PROT 6.7 05/16/2014 0820   ALBUMIN 4.4 08/05/2015 0907   ALBUMIN 3.8 05/16/2014 0820   AST 20 08/05/2015 0907   AST  24 05/16/2014 0820   ALT 16 08/05/2015 0907   ALT 19 05/16/2014 0820   ALKPHOS 57 08/05/2015 0907   ALKPHOS 60 05/16/2014 0820   BILITOT 1.0 08/05/2015 0907   BILITOT 1.06 05/16/2014 0820   GFRNONAA 62 (L) 09/03/2013 1215   GFRAA 72 (L) 09/03/2013 1215    No results found for: SPEP, UPEP  Lab Results  Component Value Date   WBC 8.4 05/13/2016   NEUTROABS 4.0 05/13/2016   HGB 15.6  05/13/2016   HCT 47.4 05/13/2016   MCV 94.5 05/13/2016   PLT 205 05/13/2016      Chemistry      Component Value Date/Time   NA 141 08/05/2015 0907   NA 140 05/16/2014 0820   K 4.4 08/05/2015 0907   K 4.4 05/16/2014 0820   CL 105 08/05/2015 0907   CO2 28 08/05/2015 0907   CO2 25 05/16/2014 0820   BUN 17 08/05/2015 0907   BUN 18.3 05/16/2014 0820   CREATININE 1.02 08/05/2015 0907   CREATININE 1.1 05/16/2014 0820      Component Value Date/Time   CALCIUM 9.4 08/05/2015 0907   CALCIUM 9.2 05/16/2014 0820   ALKPHOS 57 08/05/2015 0907   ALKPHOS 60 05/16/2014 0820   AST 20 08/05/2015 0907   AST 24 05/16/2014 0820   ALT 16 08/05/2015 0907   ALT 19 05/16/2014 0820   BILITOT 1.0 08/05/2015 0907   BILITOT 1.06 05/16/2014 0820      ASSESSMENT & PLAN:  Monoclonal B-cell lymphocytosis The patient was informed he had CLL. Looking at the trend of the absolute lymphocyte count, the more appropriate term would be monoclonal B-cell lymphocytosis of unknown significance I recommend observation only. The risk of progression to CLL is only 1% per year His absolute lymphocyte count has been under 5 for the last 3 years. I recommend follow-up with primary care doctor only with repeat CBC with differential on an annual basis. If absolute lymphocyte count is greater than 5, the patient will call me and I will resume follow-up here in the hematology clinic   No orders of the defined types were placed in this encounter.  All questions were answered. The patient knows to call the clinic with any problems, questions or concerns. No barriers to learning was detected. I spent 10 minutes counseling the patient face to face. The total time spent in the appointment was 15 minutes and more than 50% was on counseling and review of test results     [facility], [place], MD 05/13/2016 11:09 AM

## 2016-07-14 ENCOUNTER — Telehealth: Payer: Self-pay

## 2016-07-14 NOTE — Telephone Encounter (Signed)
Jonelle Sidle with [facility] called and wanted to know if we received request for documentation.   Request was faxed on 07/09/2016. No faxed received for this patient. They are refaxing to side a fax line.

## 2016-07-23 ENCOUNTER — Other Ambulatory Visit: Payer: Self-pay | Admitting: Internal Medicine

## 2016-07-23 DIAGNOSIS — I10 Essential (primary) hypertension: Secondary | ICD-10-CM

## 2016-07-23 DIAGNOSIS — E785 Hyperlipidemia, unspecified: Secondary | ICD-10-CM

## 2016-08-05 ENCOUNTER — Encounter: Payer: Self-pay | Admitting: Internal Medicine

## 2016-08-05 ENCOUNTER — Ambulatory Visit (INDEPENDENT_AMBULATORY_CARE_PROVIDER_SITE_OTHER): Payer: Medicare Other | Admitting: Internal Medicine

## 2016-08-05 ENCOUNTER — Other Ambulatory Visit (INDEPENDENT_AMBULATORY_CARE_PROVIDER_SITE_OTHER): Payer: Medicare Other

## 2016-08-05 VITALS — BP 142/80 | HR 58 | Temp 98.0°F | Resp 16 | Ht 71.0 in | Wt 178.2 lb

## 2016-08-05 DIAGNOSIS — C9111 Chronic lymphocytic leukemia of B-cell type in remission: Secondary | ICD-10-CM | POA: Diagnosis not present

## 2016-08-05 DIAGNOSIS — E785 Hyperlipidemia, unspecified: Secondary | ICD-10-CM

## 2016-08-05 DIAGNOSIS — I10 Essential (primary) hypertension: Secondary | ICD-10-CM

## 2016-08-05 DIAGNOSIS — C9110 Chronic lymphocytic leukemia of B-cell type not having achieved remission: Secondary | ICD-10-CM

## 2016-08-05 DIAGNOSIS — Z23 Encounter for immunization: Secondary | ICD-10-CM

## 2016-08-05 DIAGNOSIS — N400 Enlarged prostate without lower urinary tract symptoms: Secondary | ICD-10-CM

## 2016-08-05 DIAGNOSIS — Z0000 Encounter for general adult medical examination without abnormal findings: Secondary | ICD-10-CM

## 2016-08-05 LAB — LIPID PANEL
CHOL/HDL RATIO: 4
Cholesterol: 191 mg/dL (ref 0–200)
HDL: 42.6 mg/dL (ref >39.00)
LDL CALC: 111 mg/dL — AB (ref 0–99)
NonHDL: 148.65
TRIGLYCERIDES: 188 mg/dL — AB (ref 0.0–149.0)
VLDL: 37.6 mg/dL (ref 0.0–40.0)

## 2016-08-05 LAB — URINALYSIS, ROUTINE W REFLEX MICROSCOPIC
BILIRUBIN URINE: NEGATIVE
HGB URINE DIPSTICK: NEGATIVE
Ketones, ur: NEGATIVE
Leukocytes, UA: NEGATIVE
NITRITE: NEGATIVE
RBC / HPF: NONE SEEN (ref 0–?)
Specific Gravity, Urine: 1.015 (ref 1.000–1.030)
Total Protein, Urine: NEGATIVE
URINE GLUCOSE: NEGATIVE
Urobilinogen, UA: 0.2 (ref 0.0–1.0)
pH: 6.5 (ref 5.0–8.0)

## 2016-08-05 LAB — COMPREHENSIVE METABOLIC PANEL
ALT: 19 U/L (ref 0–53)
AST: 24 U/L (ref 0–37)
Albumin: 4.4 g/dL (ref 3.5–5.2)
Alkaline Phosphatase: 57 U/L (ref 39–117)
BUN: 16 mg/dL (ref 6–23)
CALCIUM: 9.9 mg/dL (ref 8.4–10.5)
CHLORIDE: 103 meq/L (ref 96–112)
CO2: 31 meq/L (ref 19–32)
Creatinine, Ser: 1.07 mg/dL (ref 0.40–1.50)
GFR: 72.09 mL/min (ref >60.00)
Glucose, Bld: 87 mg/dL (ref 70–99)
POTASSIUM: 4.5 meq/L (ref 3.5–5.1)
Sodium: 139 mEq/L (ref 135–145)
Total Bilirubin: 1 mg/dL (ref 0.2–1.2)
Total Protein: 6.9 g/dL (ref 6.0–8.3)

## 2016-08-05 LAB — PSA: PSA: 3.67 ng/mL (ref 0.10–4.00)

## 2016-08-05 LAB — TSH: TSH: 1.73 u[IU]/mL (ref 0.35–4.50)

## 2016-08-05 NOTE — Progress Notes (Signed)
Pre visit review using our clinic review tool, if applicable. No additional management support is needed unless otherwise documented below in the visit note. 

## 2016-08-05 NOTE — Progress Notes (Signed)
Subjective:  Patient ID: Larry Huang, male    DOB: 1946/02/26  Age: 70 y.o. MRN: [id number]  CC: Hypertension; Hyperlipidemia; and Annual Exam   HPI [name] presents for an AWV/CPX.  He tells me his blood pressure has been well controlled. He has had no recent episodes of DOE, SOB, CP, edema, palpitations, or fatigue.  He is doing well on pravastatin and his had no muscle or joint aches.  He saw his oncologist about 2 months ago regarding the CLL and was told that he is in permanent remission and that they do not anticipate needing to see him again. He feels well today and offers no complaints.   Past Medical History:  Diagnosis Date  . CLL (chronic lymphoblastic leukemia)   . Contact lens/glasses fitting    wears contacts or glasses  . GERD (gastroesophageal reflux disease)   . HTN (hypertension)   . Hyperlipidemia   . Monoclonal B-cell lymphocytosis 05/15/2015  . Snores    Past Surgical History:  Procedure Laterality Date  . COLONOSCOPY    . MICROLARYNGOSCOPY WITH CO2 LASER AND EXCISION OF VOCAL CORD LESION Right 09/04/2013   Procedure: MICROLARYNGOSCOPY WITH EXCISION OF VOCAL CORD LESION;  Surgeon: Rozetta Nunnery, MD;  Location: [place];  Service: ENT;  Laterality: Right;  . TONSILLECTOMY      reports that he has never smoked. He has never used smokeless tobacco. He reports that he does not drink alcohol or use drugs. family history includes Cancer in his father and other; Hypertension in his other; Lung cancer in his other. Allergies  Allergen Reactions  . Benazepril     cough  . Lipitor [Atorvastatin]     myalgias    Outpatient Medications Prior to Visit  Medication Sig Dispense Refill  . amLODipine (NORVASC) 5 MG tablet Take 1 tablet (5 mg total) by mouth daily. TAKE 1 TABLET (5 MG TOTAL) BY MOUTH DAILY. 90 tablet 0  . aspirin EC 81 MG tablet Take 81 mg by mouth daily.      . pravastatin (PRAVACHOL) 40 MG tablet Take 1 tablet (40  mg total) by mouth daily. 90 tablet 0   No facility-administered medications prior to visit.     ROS Review of Systems  Constitutional: Negative for activity change, appetite change, chills, diaphoresis, fatigue and fever.  HENT: Negative.  Negative for trouble swallowing.   Eyes: Negative.  Negative for photophobia and visual disturbance.  Respiratory: Negative.  Negative for apnea, cough, choking, chest tightness, shortness of breath and stridor.   Cardiovascular: Negative.  Negative for chest pain, palpitations and leg swelling.  Gastrointestinal: Negative.  Negative for abdominal pain, blood in stool, constipation, diarrhea, nausea and vomiting.  Endocrine: Negative.   Genitourinary: Negative.  Negative for difficulty urinating, penile swelling, scrotal swelling and testicular pain.  Musculoskeletal: Negative for arthralgias, back pain, myalgias and neck pain.  Skin: Negative.  Negative for color change.  Allergic/Immunologic: Negative.   Neurological: Negative.  Negative for dizziness, weakness and light-headedness.  Hematological: Negative.  Negative for adenopathy. Does not bruise/bleed easily.  Psychiatric/Behavioral: Negative.     Objective:  BP (!) 142/80 (BP Location: Left Arm, Patient Position: Sitting, Cuff Size: Large)   Pulse (!) 58   Temp 98 F (36.7 C) (Oral)   Resp 16   Ht 5\' 11"  (1.803 m)   Wt 178 lb 3 oz (80.8 kg)   SpO2 98%   BMI 24.85 kg/m   BP Readings from Last  3 Encounters:  08/05/16 (!) 142/80  05/13/16 133/70  08/05/15 (!) 142/82    Wt Readings from Last 3 Encounters:  08/05/16 178 lb 3 oz (80.8 kg)  05/13/16 177 lb 4.8 oz (80.4 kg)  08/05/15 177 lb (80.3 kg)    Physical Exam  Constitutional: No distress.  HENT:  Mouth/Throat: Oropharynx is clear and moist. No oropharyngeal exudate.  Eyes: Conjunctivae are normal. Right eye exhibits no discharge. Left eye exhibits no discharge. No scleral icterus.  Neck: Normal range of motion. Neck  supple. No JVD present. No tracheal deviation present. No thyromegaly present.  Cardiovascular: Normal rate, regular rhythm, normal heart sounds and intact distal pulses.  Exam reveals no gallop and no friction rub.   No murmur heard. Pulses:      Carotid pulses are 1+ on the right side, and 1+ on the left side.      Radial pulses are 1+ on the right side, and 1+ on the left side.       Femoral pulses are 1+ on the right side, and 1+ on the left side.      Popliteal pulses are 1+ on the right side, and 1+ on the left side.       Dorsalis pedis pulses are 1+ on the right side, and 1+ on the left side.       Posterior tibial pulses are 1+ on the right side, and 1+ on the left side.  EKG ---  Sinus  Bradycardia  WITHIN NORMAL LIMITS  Pulmonary/Chest: Effort normal and breath sounds normal. No stridor. No respiratory distress. He has no wheezes. He has no rales. He exhibits no tenderness.  Abdominal: Soft. Bowel sounds are normal. He exhibits no distension and no mass. There is no tenderness. There is no rebound and no guarding. Hernia confirmed negative in the right inguinal area and confirmed negative in the left inguinal area.  Genitourinary: Rectum normal, testes normal and penis normal. Rectal exam shows no external hemorrhoid, no internal hemorrhoid, no fissure, no mass, no tenderness, anal tone normal and guaiac negative stool. Prostate is enlarged (1+ smooth symm BPH). Prostate is not tender. Right testis shows no mass, no swelling and no tenderness. Right testis is descended. Left testis shows no mass, no swelling and no tenderness. Left testis is descended. Circumcised. No penile erythema or penile tenderness. No discharge found.  Lymphadenopathy:    He has no cervical adenopathy.       Right: No inguinal adenopathy present.       Left: No inguinal adenopathy present.  Skin: He is not diaphoretic.  Vitals reviewed.   Lab Results  Component Value Date   WBC 8.4 05/13/2016   HGB 15.6  05/13/2016   HCT 47.4 05/13/2016   PLT 205 05/13/2016   GLUCOSE 87 08/05/2016   CHOL 191 08/05/2016   TRIG 188.0 (H) 08/05/2016   HDL 42.60 08/05/2016   LDLCALC 111 (H) 08/05/2016   ALT 19 08/05/2016   AST 24 08/05/2016   NA 139 08/05/2016   K 4.5 08/05/2016   CL 103 08/05/2016   CREATININE 1.07 08/05/2016   BUN 16 08/05/2016   CO2 31 08/05/2016   TSH 1.73 08/05/2016   PSA 3.67 08/05/2016    No results found.  Assessment & Plan:   Elai was seen today for hypertension, hyperlipidemia and annual exam.  Diagnoses and all orders for this visit:  Chronic lymphocytic leukemia ([place])- testing for hepatitis C and HIV is negative, his recent CBC was  normal indicating that he is indeed in long-term remission, I will continue to monitor for signs of recurrence. -     Hepatitis C antibody; Future -     HIV antibody; Future  Essential hypertension, benign- his blood pressure is well-controlled, his EKG shows mild sinus bradycardia. He is asymptomatic with respect to this. Will continue the calcium channel blocker for now but if the bradycardia continues to be a problem or if he develop symptoms related to it then I will change his antihypertensive regimen. -     Comprehensive metabolic panel; Future -     Urinalysis, Routine w reflex microscopic (not at [facility]); Future -     EKG 12-Lead  Benign prostatic hyperplasia without lower urinary tract symptoms- his PSA is normal so I am not concerned about prostate cancer, he has no symptoms that need to be treated. -     PSA; Future -     Urinalysis, Routine w reflex microscopic (not at [facility]); Future  Hyperlipidemia with target LDL less than 130- he has achieved his LDL goal is doing well on the statin. -     Lipid panel; Future -     TSH; Future  Routine general medical examination at a health care facility  Need for prophylactic vaccination and inoculation against influenza -     Flu vaccine HIGH DOSE PF (Fluzone High dose)  Need for  prophylactic vaccination against Streptococcus pneumoniae (pneumococcus) -     Pneumococcal polysaccharide vaccine 23-valent greater than or equal to 2yo subcutaneous/IM   I am having Mr. Beltre maintain his aspirin EC, pravastatin, and amLODipine.  No orders of the defined types were placed in this encounter.   See AVS for instructions about healthy living and anticipatory guidance.   Follow-up: Return in about 6 months (around 02/02/2017).  [name], MD

## 2016-08-05 NOTE — Patient Instructions (Signed)

## 2016-08-06 LAB — HIV ANTIBODY (ROUTINE TESTING W REFLEX): HIV: NONREACTIVE

## 2016-08-06 LAB — HEPATITIS C ANTIBODY: HCV Ab: NEGATIVE

## 2016-08-08 ENCOUNTER — Encounter: Payer: Self-pay | Admitting: Internal Medicine

## 2016-08-09 NOTE — Assessment & Plan Note (Signed)

## 2016-10-22 ENCOUNTER — Other Ambulatory Visit: Payer: Self-pay | Admitting: Internal Medicine

## 2016-10-22 DIAGNOSIS — I10 Essential (primary) hypertension: Secondary | ICD-10-CM

## 2016-10-22 DIAGNOSIS — E785 Hyperlipidemia, unspecified: Secondary | ICD-10-CM

## 2017-08-08 ENCOUNTER — Encounter: Payer: Self-pay | Admitting: Internal Medicine

## 2017-08-08 ENCOUNTER — Ambulatory Visit (INDEPENDENT_AMBULATORY_CARE_PROVIDER_SITE_OTHER): Payer: Medicare Other | Admitting: Internal Medicine

## 2017-08-08 ENCOUNTER — Other Ambulatory Visit (INDEPENDENT_AMBULATORY_CARE_PROVIDER_SITE_OTHER): Payer: Medicare Other

## 2017-08-08 VITALS — BP 126/72 | HR 52 | Temp 97.9°F | Resp 16 | Ht 71.0 in | Wt 178.0 lb

## 2017-08-08 DIAGNOSIS — N400 Enlarged prostate without lower urinary tract symptoms: Secondary | ICD-10-CM

## 2017-08-08 DIAGNOSIS — C9110 Chronic lymphocytic leukemia of B-cell type not having achieved remission: Secondary | ICD-10-CM

## 2017-08-08 DIAGNOSIS — E785 Hyperlipidemia, unspecified: Secondary | ICD-10-CM | POA: Diagnosis not present

## 2017-08-08 DIAGNOSIS — R001 Bradycardia, unspecified: Secondary | ICD-10-CM

## 2017-08-08 DIAGNOSIS — Z23 Encounter for immunization: Secondary | ICD-10-CM | POA: Diagnosis not present

## 2017-08-08 DIAGNOSIS — I10 Essential (primary) hypertension: Secondary | ICD-10-CM

## 2017-08-08 DIAGNOSIS — C9190 Lymphoid leukemia, unspecified not having achieved remission: Secondary | ICD-10-CM

## 2017-08-08 DIAGNOSIS — Z0000 Encounter for general adult medical examination without abnormal findings: Secondary | ICD-10-CM

## 2017-08-08 LAB — COMPREHENSIVE METABOLIC PANEL
ALT: 23 U/L (ref 0–53)
AST: 23 U/L (ref 0–37)
Albumin: 4.5 g/dL (ref 3.5–5.2)
Alkaline Phosphatase: 49 U/L (ref 39–117)
BUN: 18 mg/dL (ref 6–23)
CALCIUM: 9.6 mg/dL (ref 8.4–10.5)
CHLORIDE: 102 meq/L (ref 96–112)
CO2: 29 meq/L (ref 19–32)
Creatinine, Ser: 1 mg/dL (ref 0.40–1.50)
GFR: 77.73 mL/min (ref >60.00)
Glucose, Bld: 88 mg/dL (ref 70–99)
POTASSIUM: 4.4 meq/L (ref 3.5–5.1)
Sodium: 138 mEq/L (ref 135–145)
TOTAL PROTEIN: 7 g/dL (ref 6.0–8.3)
Total Bilirubin: 1.2 mg/dL (ref 0.2–1.2)

## 2017-08-08 LAB — CBC WITH DIFFERENTIAL/PLATELET
BASOS PCT: 0.5 % (ref 0.0–3.0)
Basophils Absolute: 0 10*3/uL (ref 0.0–0.1)
EOS PCT: 3 % (ref 0.0–5.0)
Eosinophils Absolute: 0.3 10*3/uL (ref 0.0–0.7)
HCT: 48.9 % (ref 39.0–52.0)
Hemoglobin: 16.4 g/dL (ref 13.0–17.0)
LYMPHS ABS: 4 10*3/uL (ref 0.7–4.0)
Lymphocytes Relative: 45.7 % (ref 12.0–46.0)
MCHC: 33.6 g/dL (ref 30.0–36.0)
MCV: 95.6 fl (ref 78.0–100.0)
MONOS PCT: 7.2 % (ref 3.0–12.0)
Monocytes Absolute: 0.6 10*3/uL (ref 0.1–1.0)
NEUTROS ABS: 3.9 10*3/uL (ref 1.4–7.7)
NEUTROS PCT: 43.6 % (ref 43.0–77.0)
PLATELETS: 242 10*3/uL (ref 150.0–400.0)
RBC: 5.12 Mil/uL (ref 4.22–5.81)
RDW: 13.4 % (ref 11.5–15.5)
WBC: 8.9 10*3/uL (ref 4.0–10.5)

## 2017-08-08 LAB — LIPID PANEL
Cholesterol: 190 mg/dL (ref 0–200)
HDL: 38.4 mg/dL — AB (ref >39.00)
LDL Cholesterol: 116 mg/dL — ABNORMAL HIGH (ref 0–99)
NonHDL: 151.25
TRIGLYCERIDES: 178 mg/dL — AB (ref 0.0–149.0)
Total CHOL/HDL Ratio: 5
VLDL: 35.6 mg/dL (ref 0.0–40.0)

## 2017-08-08 LAB — THYROID PANEL WITH TSH
Free Thyroxine Index: 1.9 (ref 1.4–3.8)
T3 Uptake: 24 % (ref 22–35)
T4, Total: 8 ug/dL (ref 4.9–10.5)
TSH: 1.73 m[IU]/L (ref 0.40–4.50)

## 2017-08-08 LAB — PSA: PSA: 0.73 ng/mL (ref 0.10–4.00)

## 2017-08-08 NOTE — Patient Instructions (Signed)

## 2017-08-08 NOTE — Progress Notes (Signed)
Subjective:  Patient ID: Larry Huang, male    DOB: 14-Jul-1946  Age: 71 y.o. MRN: [id number]  CC: Hypertension; Hyperlipidemia; and Annual Exam   HPI Larry Huang presents for a CPX.  He complains of ankle swelling.  He walks 2 miles a day and denies palpitations, lightheadedness, dizziness, near syncope, chest pain, shortness of breath, or fatigue.  He tells me his blood pressure has been very well controlled.  Past Medical History:  Diagnosis Date  . CLL (chronic lymphoblastic leukemia)   . Contact lens/glasses fitting    wears contacts or glasses  . GERD (gastroesophageal reflux disease)   . HTN (hypertension)   . Hyperlipidemia   . Monoclonal B-cell lymphocytosis 05/15/2015  . Snores    Past Surgical History:  Procedure Laterality Date  . COLONOSCOPY    . TONSILLECTOMY      reports that  has never smoked. he has never used smokeless tobacco. He reports that he does not drink alcohol or use drugs. family history includes Cancer in his father and other; Hypertension in his other; Lung cancer in his other. Allergies  Allergen Reactions  . Benazepril     cough  . Lipitor [Atorvastatin]     myalgias    Outpatient Medications Prior to Visit  Medication Sig Dispense Refill  . aspirin EC 81 MG tablet Take 81 mg by mouth daily.      . pravastatin (PRAVACHOL) 40 MG tablet TAKE 1 TABLET (40 MG TOTAL) BY MOUTH DAILY. 90 tablet 3  . amLODipine (NORVASC) 5 MG tablet TAKE 1 TABLET BY MOUTH EVERY DAY 90 tablet 3   No facility-administered medications prior to visit.     ROS Review of Systems  Constitutional: Negative.  Negative for appetite change, diaphoresis, fatigue and unexpected weight change.  HENT: Negative.  Negative for sinus pressure and trouble swallowing.   Eyes: Negative.   Respiratory: Negative.  Negative for cough, chest tightness, shortness of breath and wheezing.   Cardiovascular: Positive for leg swelling. Negative for chest pain and palpitations.    Gastrointestinal: Negative for abdominal pain, blood in stool, constipation, diarrhea, nausea and vomiting.  Endocrine: Negative.  Negative for cold intolerance and heat intolerance.  Genitourinary: Negative.  Negative for decreased urine volume, difficulty urinating, discharge, penile pain, penile swelling and scrotal swelling.  Musculoskeletal: Negative for arthralgias, back pain, myalgias and neck pain.  Skin: Negative.  Negative for rash.  Allergic/Immunologic: Negative.   Neurological: Negative.  Negative for dizziness, weakness, light-headedness, numbness and headaches.  Hematological: Negative for adenopathy. Does not bruise/bleed easily.  Psychiatric/Behavioral: Negative.     Objective:  BP 126/72   Pulse (!) 52   Temp 97.9 F (36.6 C) (Oral)   Resp 16   Ht 5\' 11"  (1.803 m)   Wt 178 lb (80.7 kg)   SpO2 98%   BMI 24.83 kg/m   BP Readings from Last 3 Encounters:  08/08/17 126/72  08/05/16 (!) 142/80  05/13/16 133/70    Wt Readings from Last 3 Encounters:  08/08/17 178 lb (80.7 kg)  08/05/16 178 lb 3 oz (80.8 kg)  05/13/16 177 lb 4.8 oz (80.4 kg)    Physical Exam  Constitutional: He is oriented to person, place, and time. No distress.  HENT:  Mouth/Throat: Oropharynx is clear and moist. No oropharyngeal exudate.  Eyes: Conjunctivae are normal. Right eye exhibits no discharge. Left eye exhibits no discharge. No scleral icterus.  Neck: Normal range of motion. Neck supple. No JVD present. No thyromegaly  present.  Cardiovascular: Regular rhythm, S1 normal, S2 normal and normal heart sounds. Bradycardia present. Exam reveals no gallop and no friction rub.  No murmur heard. EKG---  Sinus  Bradycardia  WITHIN NORMAL LIMITS   Pulmonary/Chest: Effort normal and breath sounds normal. No respiratory distress. He has no wheezes. He has no rales. He exhibits no tenderness.  Abdominal: Soft. Bowel sounds are normal. He exhibits no distension and no mass. There is no  tenderness. There is no rebound and no guarding. Hernia confirmed negative in the right inguinal area and confirmed negative in the left inguinal area.  Genitourinary: Rectum normal, testes normal and penis normal. Rectal exam shows no external hemorrhoid, no internal hemorrhoid, no fissure, no mass, no tenderness, anal tone normal and guaiac negative stool. Prostate is enlarged (1+ smooth symm BPH). Prostate is not tender. Right testis shows no mass, no swelling and no tenderness. Right testis is descended. Left testis shows no mass, no swelling and no tenderness. Left testis is descended. Circumcised. No penile erythema or penile tenderness. No discharge found.  Musculoskeletal: He exhibits edema (trace pitting edema over both ankles). He exhibits no tenderness or deformity.  Lymphadenopathy:    He has no cervical adenopathy.       Right: No inguinal adenopathy present.       Left: No inguinal adenopathy present.  Neurological: He is alert and oriented to person, place, and time.  Skin: Skin is warm and dry. No rash noted. He is not diaphoretic. No erythema. No pallor.  Psychiatric: He has a normal mood and affect. His behavior is normal. Judgment and thought content normal.  Vitals reviewed.   Lab Results  Component Value Date   WBC 8.9 08/08/2017   HGB 16.4 08/08/2017   HCT 48.9 08/08/2017   PLT 242.0 08/08/2017   GLUCOSE 88 08/08/2017   CHOL 190 08/08/2017   TRIG 178.0 (H) 08/08/2017   HDL 38.40 (L) 08/08/2017   LDLCALC 116 (H) 08/08/2017   ALT 23 08/08/2017   AST 23 08/08/2017   NA 138 08/08/2017   K 4.4 08/08/2017   CL 102 08/08/2017   CREATININE 1.00 08/08/2017   BUN 18 08/08/2017   CO2 29 08/08/2017   TSH 1.73 08/08/2017   PSA 0.73 08/08/2017    No results found.  Assessment & Plan:   Larry Huang was seen today for hypertension, hyperlipidemia and annual exam.  Diagnoses and all orders for this visit:  Need for influenza vaccination -     Flu vaccine HIGH DOSE PF  (Fluzone High Dose)  Bradycardia- He has stable bradycardia and is not symptomatic with respect to this.  His lab work is negative for any secondary causes such as hypothyroidism.  Will discontinue the CCB to see if his heart rate normalizes. -     EKG 12-Lead -     Thyroid Panel With TSH; Future  Essential hypertension, benign- His blood pressure is very well controlled.  I have asked him to stop taking amlodipine since his BP is soft and is probably causing his ankle edema.  He will monitor his blood pressure at home and if it rises will transition him to an ARB. -     Comprehensive metabolic panel; Future  Chronic lymphocytic leukemia ([place])- His CBC is normal.  This is in remission. -     CBC with Differential/Platelet; Future  Hyperlipidemia with target LDL less than 130- He has achieved his LDL goal and is doing well on the statin. -  Lipid panel; Future  Benign prostatic hyperplasia without lower urinary tract symptoms- his PSA remains low so I am not concerned about prostate cancer.  He has no symptoms that need to be treated. -     PSA; Future   I have discontinued Damaree M. Cawood's amLODipine. I am also having him maintain his aspirin EC and pravastatin.  No orders of the defined types were placed in this encounter.  See AVS for instructions about healthy living and anticipatory guidance.  Follow-up: Return in about 6 months (around 02/05/2018).  [name], MD

## 2017-08-09 ENCOUNTER — Encounter: Payer: Self-pay | Admitting: Internal Medicine

## 2017-10-14 ENCOUNTER — Other Ambulatory Visit: Payer: Self-pay | Admitting: Internal Medicine

## 2017-10-14 DIAGNOSIS — E785 Hyperlipidemia, unspecified: Secondary | ICD-10-CM

## 2017-11-17 DIAGNOSIS — M545 Low back pain, unspecified: Secondary | ICD-10-CM | POA: Insufficient documentation

## 2018-06-15 LAB — FECAL OCCULT BLOOD, GUAIAC: FECAL OCCULT BLD: NEGATIVE

## 2018-07-12 ENCOUNTER — Encounter: Payer: Self-pay | Admitting: Internal Medicine

## 2018-08-10 ENCOUNTER — Encounter: Payer: Self-pay | Admitting: Internal Medicine

## 2018-08-10 ENCOUNTER — Ambulatory Visit (INDEPENDENT_AMBULATORY_CARE_PROVIDER_SITE_OTHER): Payer: Medicare Other | Admitting: Internal Medicine

## 2018-08-10 ENCOUNTER — Other Ambulatory Visit (INDEPENDENT_AMBULATORY_CARE_PROVIDER_SITE_OTHER): Payer: Medicare Other

## 2018-08-10 VITALS — BP 160/90 | HR 58 | Temp 97.8°F | Resp 16 | Ht 71.0 in | Wt 173.0 lb

## 2018-08-10 DIAGNOSIS — C9111 Chronic lymphocytic leukemia of B-cell type in remission: Secondary | ICD-10-CM

## 2018-08-10 DIAGNOSIS — C9110 Chronic lymphocytic leukemia of B-cell type not having achieved remission: Secondary | ICD-10-CM

## 2018-08-10 DIAGNOSIS — N400 Enlarged prostate without lower urinary tract symptoms: Secondary | ICD-10-CM | POA: Diagnosis not present

## 2018-08-10 DIAGNOSIS — E785 Hyperlipidemia, unspecified: Secondary | ICD-10-CM

## 2018-08-10 DIAGNOSIS — Z0001 Encounter for general adult medical examination with abnormal findings: Secondary | ICD-10-CM

## 2018-08-10 DIAGNOSIS — E559 Vitamin D deficiency, unspecified: Secondary | ICD-10-CM | POA: Insufficient documentation

## 2018-08-10 DIAGNOSIS — Z1211 Encounter for screening for malignant neoplasm of colon: Secondary | ICD-10-CM

## 2018-08-10 DIAGNOSIS — L858 Other specified epidermal thickening: Secondary | ICD-10-CM | POA: Diagnosis not present

## 2018-08-10 DIAGNOSIS — Z23 Encounter for immunization: Secondary | ICD-10-CM

## 2018-08-10 DIAGNOSIS — Z0000 Encounter for general adult medical examination without abnormal findings: Secondary | ICD-10-CM

## 2018-08-10 DIAGNOSIS — I10 Essential (primary) hypertension: Secondary | ICD-10-CM

## 2018-08-10 DIAGNOSIS — H6123 Impacted cerumen, bilateral: Secondary | ICD-10-CM

## 2018-08-10 LAB — LIPID PANEL
Cholesterol: 200 mg/dL (ref 0–200)
HDL: 39.4 mg/dL
LDL Cholesterol: 123 mg/dL — ABNORMAL HIGH (ref 0–99)
NonHDL: 160.27
Total CHOL/HDL Ratio: 5
Triglycerides: 187 mg/dL — ABNORMAL HIGH (ref 0.0–149.0)
VLDL: 37.4 mg/dL (ref 0.0–40.0)

## 2018-08-10 LAB — COMPREHENSIVE METABOLIC PANEL WITH GFR
ALT: 14 U/L (ref 0–53)
AST: 20 U/L (ref 0–37)
Albumin: 4.6 g/dL (ref 3.5–5.2)
Alkaline Phosphatase: 51 U/L (ref 39–117)
BUN: 17 mg/dL (ref 6–23)
CO2: 30 meq/L (ref 19–32)
Calcium: 9.9 mg/dL (ref 8.4–10.5)
Chloride: 102 meq/L (ref 96–112)
Creatinine, Ser: 1.03 mg/dL (ref 0.40–1.50)
GFR: 74.92 mL/min
Glucose, Bld: 88 mg/dL (ref 70–99)
Potassium: 4.6 meq/L (ref 3.5–5.1)
Sodium: 140 meq/L (ref 135–145)
Total Bilirubin: 1.3 mg/dL — ABNORMAL HIGH (ref 0.2–1.2)
Total Protein: 6.9 g/dL (ref 6.0–8.3)

## 2018-08-10 LAB — CBC WITH DIFFERENTIAL/PLATELET
Basophils Absolute: 0 K/uL (ref 0.0–0.1)
Basophils Relative: 0.4 % (ref 0.0–3.0)
Eosinophils Absolute: 0.2 K/uL (ref 0.0–0.7)
Eosinophils Relative: 2.6 % (ref 0.0–5.0)
HCT: 50.2 % (ref 39.0–52.0)
Hemoglobin: 17.1 g/dL — ABNORMAL HIGH (ref 13.0–17.0)
Lymphocytes Relative: 42.6 % (ref 12.0–46.0)
Lymphs Abs: 3.8 K/uL (ref 0.7–4.0)
MCHC: 34.2 g/dL (ref 30.0–36.0)
MCV: 94.3 fl (ref 78.0–100.0)
Monocytes Absolute: 0.6 K/uL (ref 0.1–1.0)
Monocytes Relative: 6.6 % (ref 3.0–12.0)
Neutro Abs: 4.2 K/uL (ref 1.4–7.7)
Neutrophils Relative %: 47.8 % (ref 43.0–77.0)
Platelets: 215 K/uL (ref 150.0–400.0)
RBC: 5.32 Mil/uL (ref 4.22–5.81)
RDW: 13.9 % (ref 11.5–15.5)
WBC: 8.8 K/uL (ref 4.0–10.5)

## 2018-08-10 LAB — PSA: PSA: 0.91 ng/mL (ref 0.10–4.00)

## 2018-08-10 LAB — TSH: TSH: 2.33 u[IU]/mL (ref 0.35–4.50)

## 2018-08-10 LAB — VITAMIN D 25 HYDROXY (VIT D DEFICIENCY, FRACTURES): VITD: 28.83 ng/mL — AB (ref 30.00–100.00)

## 2018-08-10 MED ORDER — AZILSARTAN MEDOXOMIL 40 MG PO TABS: 1.0000 | ORAL_TABLET | Freq: Every day | ORAL | 0 refills | Status: DC

## 2018-08-10 MED ORDER — ZOSTER VAC RECOMB ADJUVANTED 50 MCG/0.5ML IM SUSR: 0.5000 mL | Freq: Once | INTRAMUSCULAR | 1 refills | Status: AC

## 2018-08-10 MED ORDER — CHOLECALCIFEROL 50 MCG (2000 UT) PO TABS: 2.0000 | ORAL_TABLET | Freq: Every day | ORAL | 1 refills | Status: DC

## 2018-08-10 NOTE — Progress Notes (Signed)
Subjective:  Patient ID: Larry Huang, male    DOB: [birth year]  Age: 72 y.o. MRN: [id number]  CC: Hypertension and Annual Exam   HPI Larry Huang presents for a CPX.  He is concerned about a growth on the right side of his face.  He is also concerned that his blood pressure is not well controlled.  He was previously treated for hypertension but has not been taking an antihypertensive recently.  He is very active and denies any recent episodes of CP, DOE, palpitations, edema, or fatigue.   Past Medical History:  Diagnosis Date  . CLL (chronic lymphoblastic leukemia)   . Contact lens/glasses fitting    wears contacts or glasses  . GERD (gastroesophageal reflux disease)   . HTN (hypertension)   . Hyperlipidemia   . Monoclonal B-cell lymphocytosis 05/15/2015  . Snores    Past Surgical History:  Procedure Laterality Date  . COLONOSCOPY    . MICROLARYNGOSCOPY WITH CO2 LASER AND EXCISION OF VOCAL CORD LESION Right 09/04/2013   Procedure: MICROLARYNGOSCOPY WITH EXCISION OF VOCAL CORD LESION;  Surgeon: Rozetta Nunnery, MD;  Location: [place];  Service: ENT;  Laterality: Right;  . TONSILLECTOMY      reports that he has never smoked. He has never used smokeless tobacco. He reports that he does not drink alcohol or use drugs. family history includes Cancer in his father and other; Hypertension in his other; Lung cancer in his other. Allergies  Allergen Reactions  . Benazepril     cough  . Lipitor [Atorvastatin]     myalgias    Outpatient Medications Prior to Visit  Medication Sig Dispense Refill  . aspirin EC 81 MG tablet Take 81 mg by mouth daily.      . pravastatin (PRAVACHOL) 40 MG tablet Take 1 tablet (40 mg total) by mouth daily. 90 tablet 3   No facility-administered medications prior to visit.     ROS Review of Systems  Constitutional: Negative.  Negative for diaphoresis and fatigue.  HENT: Negative.   Eyes: Negative for visual disturbance.    Respiratory: Negative for apnea, cough, chest tightness, shortness of breath and wheezing.   Cardiovascular: Negative for chest pain, palpitations and leg swelling.  Gastrointestinal: Negative for abdominal pain, constipation, diarrhea, nausea and vomiting.  Endocrine: Negative.   Genitourinary: Negative.  Negative for difficulty urinating, penile swelling, scrotal swelling, testicular pain and urgency.  Musculoskeletal: Negative.  Negative for arthralgias, back pain and myalgias.  Skin: Negative.  Negative for color change.  Neurological: Negative.  Negative for dizziness, weakness, light-headedness and headaches.  Hematological: Negative for adenopathy. Does not bruise/bleed easily.  Psychiatric/Behavioral: Negative.     Objective:  BP (!) 160/90 (BP Location: Left Arm, Patient Position: Sitting, Cuff Size: Normal) Comment: BP (R) 160/90 (L) 160/90  Pulse (!) 58   Temp 97.8 F (36.6 C) (Oral)   Resp 16   Ht 5\' 11"  (1.803 m)   Wt 173 lb (78.5 kg)   SpO2 98%   BMI 24.13 kg/m   BP Readings from Last 3 Encounters:  08/10/18 (!) 160/90  08/08/17 126/72  08/05/16 (!) 142/80    Wt Readings from Last 3 Encounters:  08/10/18 173 lb (78.5 kg)  08/08/17 178 lb (80.7 kg)  08/05/16 178 lb 3 oz (80.8 kg)    Physical Exam  Constitutional: He is oriented to person, place, and time. No distress.  HENT:  Mouth/Throat: Oropharynx is clear and moist. No oropharyngeal exudate.  Eyes:  Conjunctivae are normal. No scleral icterus.  Neck: Normal range of motion. Neck supple. No JVD present. No thyromegaly present.  Cardiovascular: Normal rate, regular rhythm and normal heart sounds. Exam reveals no gallop.  No murmur heard. EKG ----  Sinus  Bradycardia  -Prominent R(V1) -nonspecific.   BORDERLINE - no change from the prior EKG   Pulmonary/Chest: Effort normal and breath sounds normal. No respiratory distress. He has no wheezes. He has no rales.  Abdominal: Soft. Bowel sounds are  normal. He exhibits no mass. There is no hepatosplenomegaly. There is no tenderness. Hernia confirmed negative in the right inguinal area and confirmed negative in the left inguinal area.  Genitourinary: Rectum normal, testes normal and penis normal. Rectal exam shows no external hemorrhoid, no internal hemorrhoid, no fissure, no mass, no tenderness, anal tone normal and guaiac negative stool. Prostate is enlarged (1+ BPH). Prostate is not tender. Right testis shows no mass, no swelling and no tenderness. Left testis shows no mass, no swelling and no tenderness. Circumcised. No penile erythema or penile tenderness. No discharge found.  Musculoskeletal: Normal range of motion. He exhibits no edema, tenderness or deformity.  Lymphadenopathy:    He has no cervical adenopathy. No inguinal adenopathy noted on the right or left side.  Neurological: He is alert and oriented to person, place, and time.  Skin: Skin is warm and dry. No rash noted. He is not diaphoretic.  There is a tiny cutaneous horn on the right malar surface.  Psychiatric: He has a normal mood and affect. His behavior is normal. Judgment and thought content normal.  Vitals reviewed.   Lab Results  Component Value Date   WBC 8.8 08/10/2018   HGB 17.1 (H) 08/10/2018   HCT 50.2 08/10/2018   PLT 215.0 08/10/2018   GLUCOSE 88 08/10/2018   CHOL 200 08/10/2018   TRIG 187.0 (H) 08/10/2018   HDL 39.40 08/10/2018   LDLCALC 123 (H) 08/10/2018   ALT 14 08/10/2018   AST 20 08/10/2018   NA 140 08/10/2018   K 4.6 08/10/2018   CL 102 08/10/2018   CREATININE 1.03 08/10/2018   BUN 17 08/10/2018   CO2 30 08/10/2018   TSH 2.33 08/10/2018   PSA 0.91 08/10/2018    No results found.  Assessment & Plan:   Larry Huang was seen today for hypertension and annual exam.  Diagnoses and all orders for this visit:  Essential hypertension, benign- He has developed stage II hypertension.  I will treat the vitamin D deficiency.  His EKG is negative for  LVH and his labs are negative for secondary causes or endorgan damage.  I recommended that he start treating this with an ARB. -     Comprehensive metabolic panel; Future -     TSH; Future -     VITAMIN D 25 Hydroxy (Vit-D Deficiency, Fractures); Future -     EKG 12-Lead -     Azilsartan Medoxomil (EDARBI) 40 MG TABS; Take 1 tablet by mouth daily.  Routine general medical examination at a health care facility  Benign prostatic hyperplasia without lower urinary tract symptoms- His PSA is low which is reassuring that he does not have prostate cancer.  He has no symptoms that need to be treated. -     PSA; Future  Hyperlipidemia with target LDL less than 130- He has achieved his LDL goal and is doing well on the statin. -     Lipid panel; Future -     TSH; Future  Chronic  lymphocytic leukemia ([place])- His white cell count is normal.  This is in full remission. -     CBC with Differential/Platelet; Future  Need for Tdap vaccination -     Tdap vaccine greater than or equal to 7yo IM  Need for influenza vaccination -     Flu vaccine HIGH DOSE PF (Fluzone High dose)  Colon cancer screening -     Cologuard  Cutaneous horn -     Ambulatory referral to Dermatology  Hearing loss of both ears due to cerumen impaction  Vitamin D deficiency disease -     Cholecalciferol 50 MCG (2000 UT) TABS; Take 2 tablets (4,000 Units total) by mouth daily.  Other orders -     Zoster Vaccine Adjuvanted [facility]) injection; Inject 0.5 mLs into the muscle once for 1 dose.   I am having Ad M. Bazaldua start on Zoster Vaccine Adjuvanted, Azilsartan Medoxomil, and Cholecalciferol. I am also having him maintain his aspirin EC and pravastatin.  Meds ordered this encounter  Medications  . Zoster Vaccine Adjuvanted [facility]) injection    Sig: Inject 0.5 mLs into the muscle once for 1 dose.    Dispense:  0.5 mL    Refill:  1  . Azilsartan Medoxomil (EDARBI) 40 MG TABS    Sig: Take 1 tablet by mouth daily.     Dispense:  70 tablet    Refill:  0  . Cholecalciferol 50 MCG (2000 UT) TABS    Sig: Take 2 tablets (4,000 Units total) by mouth daily.    Dispense:  90 tablet    Refill:  1   See AVS for instructions about healthy living and anticipatory guidance.  Follow-up: Return in about 2 months (around 10/10/2018).  [name], MD

## 2018-08-10 NOTE — Patient Instructions (Signed)

## 2018-08-11 ENCOUNTER — Telehealth: Payer: Self-pay | Admitting: Internal Medicine

## 2018-08-11 NOTE — Telephone Encounter (Signed)
Copied from [place] [id number]. Topic: Quick Communication - See Telephone Encounter >> Aug 11, 2018 10:40 AM Ahmed Prima L wrote: CRM for notification. See Telephone encounter for: 08/11/18.  Patient states that he spoke with the nurse earlier and she advised him to pick up a supplement at the pharmacy and take one pill daily with a glass of milk. He said that on the bottle it says to take 2 pills daily with a glass of milk. He would like to know which he needs to do?

## 2018-08-11 NOTE — Telephone Encounter (Signed)
Pt contacted and informed to follow directions on the bottle.

## 2018-08-13 NOTE — Assessment & Plan Note (Signed)

## 2018-09-18 ENCOUNTER — Telehealth: Payer: Self-pay | Admitting: Internal Medicine

## 2018-09-18 DIAGNOSIS — I10 Essential (primary) hypertension: Secondary | ICD-10-CM

## 2018-09-18 MED ORDER — AZILSARTAN MEDOXOMIL 40 MG PO TABS: 1.0000 | ORAL_TABLET | Freq: Every day | ORAL | 0 refills | Status: DC

## 2018-09-18 NOTE — Telephone Encounter (Signed)
Copied from [place] [id number]. Topic: Quick Communication - See Telephone Encounter >> Sep 18, 2018 11:11 AM Blase Mess A wrote: CRM for notification. See Telephone encounter for: 09/18/18.  Patient is calling for Dr. Ronnald Ramp Assistant regarding a sample of Edarbi that Dr. Ronnald Ramp gave him. Patient has enough for 10 more days.  He did not want to run out. Please advise [phone]

## 2018-09-18 NOTE — Telephone Encounter (Signed)
Patient informed, appt made

## 2018-09-18 NOTE — Telephone Encounter (Signed)
Can you call pt and let him know that he is due for follow around mid January. I have sent refill of the Edarbi to his pharmacy.

## 2018-10-04 LAB — COLOGUARD: Cologuard: NEGATIVE

## 2018-10-12 ENCOUNTER — Encounter: Payer: Self-pay | Admitting: Internal Medicine

## 2018-10-19 ENCOUNTER — Other Ambulatory Visit: Payer: Self-pay | Admitting: Internal Medicine

## 2018-10-19 DIAGNOSIS — E785 Hyperlipidemia, unspecified: Secondary | ICD-10-CM

## 2018-10-20 ENCOUNTER — Other Ambulatory Visit: Payer: Self-pay | Admitting: Internal Medicine

## 2018-10-20 DIAGNOSIS — I10 Essential (primary) hypertension: Secondary | ICD-10-CM

## 2018-10-23 ENCOUNTER — Ambulatory Visit: Payer: Medicare Other | Admitting: Internal Medicine

## 2018-10-23 ENCOUNTER — Other Ambulatory Visit (INDEPENDENT_AMBULATORY_CARE_PROVIDER_SITE_OTHER): Payer: Medicare Other

## 2018-10-23 ENCOUNTER — Encounter: Payer: Self-pay | Admitting: Internal Medicine

## 2018-10-23 VITALS — BP 112/72 | HR 60 | Temp 97.9°F | Resp 16 | Ht 71.0 in | Wt 176.0 lb

## 2018-10-23 DIAGNOSIS — I10 Essential (primary) hypertension: Secondary | ICD-10-CM

## 2018-10-23 LAB — BASIC METABOLIC PANEL
BUN: 30 mg/dL — ABNORMAL HIGH (ref 6–23)
CO2: 26 meq/L (ref 19–32)
Calcium: 9.5 mg/dL (ref 8.4–10.5)
Chloride: 105 mEq/L (ref 96–112)
Creatinine, Ser: 1.28 mg/dL (ref 0.40–1.50)
GFR: 54.82 mL/min — ABNORMAL LOW (ref >60.00)
Glucose, Bld: 95 mg/dL (ref 70–99)
Potassium: 5 mEq/L (ref 3.5–5.1)
Sodium: 139 mEq/L (ref 135–145)

## 2018-10-23 NOTE — Patient Instructions (Signed)

## 2018-10-23 NOTE — Progress Notes (Signed)
Subjective:  Patient ID: Larry Huang, male    DOB: 1946-06-05  Age: 73 y.o. MRN: [id number]  CC: Hypertension   HPI Larry Huang presents for f/up - He does not monitor his blood pressure at home.  Since starting the ARB he complains of intermittent dizziness.  Outpatient Medications Prior to Visit  Medication Sig Dispense Refill  . aspirin EC 81 MG tablet Take 81 mg by mouth daily.      . Cholecalciferol 50 MCG (2000 UT) TABS Take 2 tablets (4,000 Units total) by mouth daily. (Patient taking differently: Take 1 tablet by mouth daily. ) 90 tablet 1  . pravastatin (PRAVACHOL) 40 MG tablet TAKE 1 TABLET BY MOUTH EVERY DAY 90 tablet 1  . Azilsartan Medoxomil (EDARBI) 40 MG TABS Take 1 tablet by mouth daily. 30 tablet 0   No facility-administered medications prior to visit.     ROS Review of Systems  Constitutional: Negative for diaphoresis and fatigue.  HENT: Negative.  Negative for trouble swallowing.   Eyes: Negative for visual disturbance.  Respiratory: Negative for cough, chest tightness, shortness of breath and wheezing.   Cardiovascular: Negative for chest pain, palpitations and leg swelling.  Gastrointestinal: Negative for abdominal pain, constipation, diarrhea and nausea.  Genitourinary: Negative.  Negative for difficulty urinating and dysuria.  Musculoskeletal: Negative.  Negative for arthralgias and myalgias.  Skin: Negative.  Negative for color change and pallor.  Neurological: Positive for dizziness and light-headedness. Negative for weakness and headaches.  Hematological: Negative for adenopathy. Does not bruise/bleed easily.  Psychiatric/Behavioral: Negative.     Objective:  BP 112/72 (BP Location: Left Arm, Patient Position: Sitting, Cuff Size: Large)   Pulse 60   Temp 97.9 F (36.6 C) (Oral)   Resp 16   Ht 5\' 11"  (1.803 m)   Wt 176 lb (79.8 kg)   SpO2 98%   BMI 24.55 kg/m   BP Readings from Last 3 Encounters:  10/23/18 112/72  08/10/18 (!) 160/90    08/08/17 126/72    Wt Readings from Last 3 Encounters:  10/23/18 176 lb (79.8 kg)  08/10/18 173 lb (78.5 kg)  08/08/17 178 lb (80.7 kg)    Physical Exam Vitals signs reviewed.  Constitutional:      Appearance: Normal appearance.  HENT:     Nose: Nose normal. No congestion or rhinorrhea.     Mouth/Throat:     Mouth: Mucous membranes are moist.     Pharynx: Oropharynx is clear.  Eyes:     General: No scleral icterus.    Conjunctiva/sclera: Conjunctivae normal.  Neck:     Musculoskeletal: Normal range of motion and neck supple.  Cardiovascular:     Rate and Rhythm: Normal rate.     Heart sounds: No murmur. No gallop.   Pulmonary:     Effort: Pulmonary effort is normal.     Breath sounds: No stridor. No wheezing, rhonchi or rales.  Abdominal:     General: Abdomen is flat.     Palpations: There is no mass.     Tenderness: There is no abdominal tenderness.  Musculoskeletal: Normal range of motion.        General: No swelling.     Right lower leg: No edema.     Left lower leg: No edema.  Skin:    General: Skin is warm and dry.  Neurological:     General: No focal deficit present.     Mental Status: He is oriented to person, place, and time.  Mental status is at baseline.     Lab Results  Component Value Date   WBC 8.8 08/10/2018   HGB 17.1 (H) 08/10/2018   HCT 50.2 08/10/2018   PLT 215.0 08/10/2018   GLUCOSE 95 10/23/2018   CHOL 200 08/10/2018   TRIG 187.0 (H) 08/10/2018   HDL 39.40 08/10/2018   LDLCALC 123 (H) 08/10/2018   ALT 14 08/10/2018   AST 20 08/10/2018   NA 139 10/23/2018   K 5.0 10/23/2018   CL 105 10/23/2018   CREATININE 1.28 10/23/2018   BUN 30 (H) 10/23/2018   CO2 26 10/23/2018   TSH 2.33 08/10/2018   PSA 0.91 08/10/2018    No results found.  Assessment & Plan:   Larry Huang was seen today for hypertension.  Diagnoses and all orders for this visit:  Essential hypertension, benign- His blood pressure is over controlled and he is  symptomatic.  His potassium level is up to 5.0 and he is prerenal with a BUN of 30.  I have therefore asked him to stop taking the ARB and to monitor his blood pressure at home.  If he needs an antihypertensive I think he should avoid ARB's.  I think a CCB would be a better option for him. -     Basic metabolic panel; Future   I have discontinued Larry Huang's Azilsartan Medoxomil. I am also having him maintain his aspirin EC, Cholecalciferol, and pravastatin.  No orders of the defined types were placed in this encounter.    Follow-up: Return in about 6 months (around 04/23/2019).  [name], MD

## 2018-11-19 ENCOUNTER — Other Ambulatory Visit: Payer: Self-pay | Admitting: Internal Medicine

## 2018-11-19 DIAGNOSIS — E559 Vitamin D deficiency, unspecified: Secondary | ICD-10-CM

## 2018-12-05 ENCOUNTER — Other Ambulatory Visit (INDEPENDENT_AMBULATORY_CARE_PROVIDER_SITE_OTHER): Payer: Medicare Other

## 2018-12-05 ENCOUNTER — Encounter: Payer: Self-pay | Admitting: Internal Medicine

## 2018-12-05 ENCOUNTER — Ambulatory Visit: Payer: Medicare Other | Admitting: Internal Medicine

## 2018-12-05 VITALS — BP 146/82 | HR 51 | Temp 97.8°F | Resp 16 | Ht 71.0 in | Wt 176.2 lb

## 2018-12-05 DIAGNOSIS — I10 Essential (primary) hypertension: Secondary | ICD-10-CM

## 2018-12-05 LAB — BASIC METABOLIC PANEL
BUN: 19 mg/dL (ref 6–23)
CO2: 25 mEq/L (ref 19–32)
Calcium: 9.3 mg/dL (ref 8.4–10.5)
Chloride: 107 mEq/L (ref 96–112)
Creatinine, Ser: 1.14 mg/dL (ref 0.40–1.50)
GFR: 62.64 mL/min (ref >60.00)
Glucose, Bld: 90 mg/dL (ref 70–99)
Potassium: 4.1 mEq/L (ref 3.5–5.1)
Sodium: 141 mEq/L (ref 135–145)

## 2018-12-05 LAB — CBC WITH DIFFERENTIAL/PLATELET
Basophils Absolute: 0 10*3/uL (ref 0.0–0.1)
Basophils Relative: 0.3 % (ref 0.0–3.0)
Eosinophils Absolute: 0.5 10*3/uL (ref 0.0–0.7)
Eosinophils Relative: 5.2 % — ABNORMAL HIGH (ref 0.0–5.0)
HEMATOCRIT: 46.9 % (ref 39.0–52.0)
Hemoglobin: 15.9 g/dL (ref 13.0–17.0)
LYMPHS PCT: 41 % (ref 12.0–46.0)
Lymphs Abs: 3.6 10*3/uL (ref 0.7–4.0)
MCHC: 33.8 g/dL (ref 30.0–36.0)
MCV: 95.5 fl (ref 78.0–100.0)
Monocytes Absolute: 0.7 10*3/uL (ref 0.1–1.0)
Monocytes Relative: 7.9 % (ref 3.0–12.0)
Neutro Abs: 4 10*3/uL (ref 1.4–7.7)
Neutrophils Relative %: 45.6 % (ref 43.0–77.0)
Platelets: 230 10*3/uL (ref 150.0–400.0)
RBC: 4.91 Mil/uL (ref 4.22–5.81)
RDW: 13.8 % (ref 11.5–15.5)
WBC: 8.8 10*3/uL (ref 4.0–10.5)

## 2018-12-05 NOTE — Progress Notes (Signed)
Subjective:  Patient ID: Larry Huang, male    DOB: [birth year]  Age: 73 y.o. MRN: [id number]  CC: Hypertension   HPI [name] presents for a BP check - He has been very active and working on his lifestyle modifications to control his blood pressure.  He denies any recent episodes of CP or DOE.  He tells me that his blood pressure has been less than 140/90 at home.  Outpatient Medications Prior to Visit  Medication Sig Dispense Refill  . aspirin EC 81 MG tablet Take 81 mg by mouth daily.      . Cholecalciferol ([employer]S D3) 50 MCG (2000 UT) CAPS Take 1 capsule (2,000 Units total) by mouth daily. 180 capsule 1  . pravastatin (PRAVACHOL) 40 MG tablet TAKE 1 TABLET BY MOUTH EVERY DAY 90 tablet 1   No facility-administered medications prior to visit.     ROS Review of Systems  Constitutional: Negative for diaphoresis and fatigue.  HENT: Negative.   Eyes: Negative for visual disturbance.  Respiratory: Negative for cough, chest tightness, shortness of breath and wheezing.   Cardiovascular: Negative for chest pain, palpitations and leg swelling.  Gastrointestinal: Negative for abdominal pain, constipation, diarrhea, nausea and vomiting.  Genitourinary: Negative.  Negative for difficulty urinating and dysuria.  Musculoskeletal: Negative.  Negative for arthralgias and myalgias.  Skin: Negative for color change and pallor.  Neurological: Negative for dizziness, weakness and light-headedness.  Hematological: Negative for adenopathy. Does not bruise/bleed easily.  Psychiatric/Behavioral: Negative.     Objective:  BP (!) 146/82 (BP Location: Left Arm, Patient Position: Sitting, Cuff Size: Normal)   Pulse (!) 51   Temp 97.8 F (36.6 C) (Oral)   Resp 16   Ht 5\' 11"  (1.803 m)   Wt 176 lb 4 oz (79.9 kg)   SpO2 96%   BMI 24.58 kg/m   BP Readings from Last 3 Encounters:  12/05/18 (!) 146/82  10/23/18 112/72  08/10/18 (!) 160/90    Wt Readings from Last 3 Encounters:  12/05/18  176 lb 4 oz (79.9 kg)  10/23/18 176 lb (79.8 kg)  08/10/18 173 lb (78.5 kg)    Physical Exam Vitals signs reviewed.  HENT:     Nose: Nose normal. No congestion or rhinorrhea.     Mouth/Throat:     Mouth: Mucous membranes are moist.     Pharynx: Oropharynx is clear. No oropharyngeal exudate or posterior oropharyngeal erythema.  Eyes:     General: No scleral icterus.    Conjunctiva/sclera: Conjunctivae normal.  Neck:     Musculoskeletal: Normal range of motion and neck supple.  Cardiovascular:     Rate and Rhythm: Normal rate and regular rhythm.     Heart sounds: No murmur. No gallop.   Pulmonary:     Effort: Pulmonary effort is normal.     Breath sounds: No stridor. No wheezing, rhonchi or rales.  Abdominal:     General: Abdomen is flat. Bowel sounds are normal.     Palpations: There is no hepatomegaly, splenomegaly or mass.     Tenderness: There is no abdominal tenderness. There is no guarding.  Musculoskeletal: Normal range of motion.        General: No swelling.     Right lower leg: No edema.     Left lower leg: No edema.  Skin:    General: Skin is warm and dry.  Neurological:     General: No focal deficit present.     Mental Status: He is  oriented to person, place, and time. Mental status is at baseline.     Lab Results  Component Value Date   WBC 8.8 12/05/2018   HGB 15.9 12/05/2018   HCT 46.9 12/05/2018   PLT 230.0 12/05/2018   GLUCOSE 90 12/05/2018   CHOL 200 08/10/2018   TRIG 187.0 (H) 08/10/2018   HDL 39.40 08/10/2018   LDLCALC 123 (H) 08/10/2018   ALT 14 08/10/2018   AST 20 08/10/2018   NA 141 12/05/2018   K 4.1 12/05/2018   CL 107 12/05/2018   CREATININE 1.14 12/05/2018   BUN 19 12/05/2018   CO2 25 12/05/2018   TSH 2.33 08/10/2018   PSA 0.91 08/10/2018    No results found.  Assessment & Plan:   Benjaman was seen today for hypertension.  Diagnoses and all orders for this visit:  Essential hypertension, benign- His blood pressure is  adequately well controlled.  Electrolytes and renal function are normal.  He will continue to work on his lifestyle modifications. -     CBC with Differential/Platelet; Future -     Basic metabolic panel; Future   I am having Larry Huang maintain his aspirin EC, pravastatin, and Cholecalciferol.  No orders of the defined types were placed in this encounter.    Follow-up: Return in about 6 months (around 06/07/2019).  Larry Calico, MD

## 2018-12-05 NOTE — Patient Instructions (Signed)

## 2019-04-18 ENCOUNTER — Other Ambulatory Visit: Payer: Self-pay | Admitting: Internal Medicine

## 2019-04-18 DIAGNOSIS — E785 Hyperlipidemia, unspecified: Secondary | ICD-10-CM

## 2019-06-07 ENCOUNTER — Ambulatory Visit (INDEPENDENT_AMBULATORY_CARE_PROVIDER_SITE_OTHER): Payer: Medicare Other

## 2019-06-07 ENCOUNTER — Other Ambulatory Visit: Payer: Self-pay

## 2019-06-07 DIAGNOSIS — Z23 Encounter for immunization: Secondary | ICD-10-CM

## 2019-08-13 NOTE — Progress Notes (Addendum)
Subjective:   Larry Huang is a 73 y.o. male who presents for Medicare Annual/Subsequent preventive examination.  Review of Systems:   Cardiac Risk Factors include: advanced age (>21men, >28 women);male gender;hypertension Sleep patterns: feels rested on waking, gets up 2-3 times nightly to void and sleeps 6-7 hours nightly.    Home Safety/Smoke Alarms: Feels safe in home. Smoke alarms in place.  Living environment; residence and Firearm Safety: 1-story house/ trailer. Lives with wife, no needs for DME, good support system Seat Belt Safety/Bike Helmet: Wears seat belt.     Objective:    Vitals: BP 138/88   Pulse (!) 56   Ht 5\' 11"  (1.803 m)   Wt 175 lb (79.4 kg)   SpO2 98%   BMI 24.41 kg/m   Body mass index is 24.41 kg/m.  Advanced Directives 08/14/2019 08/09/2016 05/13/2016 08/11/2015 05/15/2015 05/16/2014 08/31/2013  Does Patient Have a Medical Advance Directive? Yes No;Yes Yes Yes No Yes Patient has advance directive, copy not in chart  Type of Advance Directive [place];Living will [place];Living will [place];Living will [place];Living will - Living will;Healthcare Power of Attorney Living will  Does patient want to make changes to medical advance directive? - No - Patient declined No - Patient declined No - Patient declined - - -  Copy of [place] in Chart? No - copy requested Yes No - copy requested Yes - No - copy requested -  Would patient like information on creating a medical advance directive? - No - patient declined information - - No - patient declined information - -    Tobacco Social History   Tobacco Use  Smoking Status Never Smoker  Smokeless Tobacco Never Used     Counseling given: Not Answered  Past Medical History:  Diagnosis Date  . CLL (chronic lymphoblastic leukemia)   . Contact lens/glasses fitting    wears contacts or glasses  . GERD  (gastroesophageal reflux disease)   . HTN (hypertension)   . Hyperlipidemia   . Monoclonal B-cell lymphocytosis 05/15/2015  . Snores    Past Surgical History:  Procedure Laterality Date  . COLONOSCOPY    . MICROLARYNGOSCOPY WITH CO2 LASER AND EXCISION OF VOCAL CORD LESION Right 09/04/2013   Procedure: MICROLARYNGOSCOPY WITH EXCISION OF VOCAL CORD LESION;  Surgeon: Rozetta Nunnery, MD;  Location: [facility];  Service: ENT;  Laterality: Right;  . TONSILLECTOMY     Family History  Problem Relation Age of Onset  . Cancer Father        lung ca  . Lung cancer Other   . Hypertension Other   . Cancer Other        Lung Cancer   Social History   Socioeconomic History  . Marital status: Married    Spouse name: Not on file  . Number of children: 2  . Years of education: Not on file  . Highest education level: Not on file  Occupational History  . Occupation: Media planner at [employer]  . Financial resource strain: Not hard at all  . Food insecurity    Worry: Never true    Inability: Never true  . Transportation needs    Medical: No    Non-medical: No  Tobacco Use  . Smoking status: Never Smoker  . Smokeless tobacco: Never Used  Substance and Sexual Activity  . Alcohol use: No  Comment: rare  . Drug use: No  . Sexual activity: Yes  Lifestyle  . Physical activity    Days per week: 5 days    Minutes per session: 60 min  . Stress: Not at all  Relationships  . Social connections    Talks on phone: More than three times a week    Gets together: More than three times a week    Attends religious service: More than 4 times per year    Active member of club or organization: Yes    Attends meetings of clubs or organizations: More than 4 times per year    Relationship status: Married  Other Topics Concern  . Not on file  Social History Narrative   Regular Exercise -  YES    Outpatient Encounter  Medications as of 08/14/2019  Medication Sig  . aspirin EC 81 MG tablet Take 81 mg by mouth daily.    . Cholecalciferol (CVS D3) 50 MCG (2000 UT) CAPS Take 1 capsule (2,000 Units total) by mouth daily.  . pravastatin (PRAVACHOL) 40 MG tablet TAKE 1 TABLET BY MOUTH EVERY DAY   No facility-administered encounter medications on file as of 08/14/2019.     Activities of Daily Living In your present state of health, do you have any difficulty performing the following activities: 08/14/2019  Hearing? N  Vision? N  Difficulty concentrating or making decisions? N  Walking or climbing stairs? N  Dressing or bathing? N  Doing errands, shopping? N  Preparing Food and eating ? N  Using the Toilet? N  In the past six months, have you accidently leaked urine? N  Do you have problems with loss of bowel control? N  Managing your Medications? N  Managing your Finances? N  Housekeeping or managing your Housekeeping? N  Some recent data might be hidden    Patient Care Team: Janith Lima, MD as PCP - General Heath Lark, MD as Consulting Physician (Hematology and Oncology)   Assessment:   This is a routine wellness examination for Larry Huang. Physical assessment deferred to PCP.  Exercise Activities and Dietary recommendations Current Exercise Habits: Home exercise routine, Type of exercise: walking;calisthenics, Time (Minutes): 60, Frequency (Times/Week): 5, Weekly Exercise (Minutes/Week): 300, Intensity: Mild, Exercise limited by: None identified  Diet (meal preparation, eat out, water intake, caffeinated beverages, dairy products, fruits and vegetables): in general, a "healthy" diet  , well balanced eats a variety of fruits and vegetables daily, limits salt, fat/cholesterol, sugar,carbohydrates,caffeine, drinks 6-8 glasses of water daily.  Goals   None     Fall Risk Fall Risk  08/14/2019 08/13/2018 08/10/2018 08/09/2017 08/08/2017  Falls in the past year? 0 0 0 No No  Number falls in past  yr: 0 - 0 - -  Injury with Fall? 0 - 0 - -  Follow up - - Falls evaluation completed - -    Depression Screen PHQ 2/9 Scores 08/14/2019 08/13/2018 08/10/2018 08/09/2017  PHQ - 2 Score 0 0 0 0    Cognitive Function     6CIT Screen 08/14/2019  What Year? 0 points  What month? 0 points  What time? 0 points  Count back from 20 0 points  Months in reverse 0 points  Repeat phrase 0 points  Total Score 0    Immunization History  Administered Date(s) Administered  . Fluad Quad(high Dose 65+) 06/07/2019  . Influenza Split 07/19/2012  . Influenza Whole 08/31/2010  . Influenza, High Dose Seasonal PF 08/05/2016, 08/08/2017, 08/10/2018  .  Influenza,inj,Quad PF,6+ Mos 06/29/2013, 06/28/2014, 08/05/2015  . Pneumococcal Conjugate-13 08/05/2015  . Pneumococcal Polysaccharide-23 12/11/2008, 08/05/2016  . Td 03/27/2008  . Tdap 08/10/2018  . Zoster 11/29/2012   Screening Tests Health Maintenance  Topic Date Due  . Fecal DNA (Cologuard)  10/04/2021  . TETANUS/TDAP  08/10/2028  . INFLUENZA VACCINE  Completed  . Hepatitis C Screening  Completed  . PNA vac Low Risk Adult  Completed       Plan:    Reviewed health maintenance screenings with patient today and relevant education, vaccines, and/or referrals were provided.   I have personally reviewed and noted the following in the patient's chart:   . Medical and social history . Use of alcohol, tobacco or illicit drugs  . Current medications and supplements . Functional ability and status . Nutritional status . Physical activity . Advanced directives . List of other physicians . Vitals . Screenings to include cognitive, depression, and falls . Referrals and appointments  In addition, I have reviewed and discussed with patient certain preventive protocols, quality metrics, and best practice recommendations. A written personalized care plan for preventive services as well as general preventive health recommendations were provided  to patient.     Michiel Cowboy, RN  08/14/2019  Medical screening examination/treatment/procedure(s) were performed by non-physician practitioner and as supervising physician I was immediately available for consultation/collaboration. I agree with above. [name], MD

## 2019-08-14 ENCOUNTER — Ambulatory Visit (INDEPENDENT_AMBULATORY_CARE_PROVIDER_SITE_OTHER): Payer: Medicare Other | Admitting: *Deleted

## 2019-08-14 ENCOUNTER — Other Ambulatory Visit: Payer: Self-pay

## 2019-08-14 ENCOUNTER — Encounter: Payer: Self-pay | Admitting: Internal Medicine

## 2019-08-14 ENCOUNTER — Other Ambulatory Visit (INDEPENDENT_AMBULATORY_CARE_PROVIDER_SITE_OTHER): Payer: Medicare Other

## 2019-08-14 ENCOUNTER — Ambulatory Visit (INDEPENDENT_AMBULATORY_CARE_PROVIDER_SITE_OTHER): Payer: Medicare Other | Admitting: Internal Medicine

## 2019-08-14 VITALS — BP 138/88 | HR 56 | Temp 98.1°F | Resp 16 | Ht 71.0 in | Wt 175.5 lb

## 2019-08-14 VITALS — BP 138/88 | HR 56 | Ht 71.0 in | Wt 175.0 lb

## 2019-08-14 DIAGNOSIS — E559 Vitamin D deficiency, unspecified: Secondary | ICD-10-CM

## 2019-08-14 DIAGNOSIS — N400 Enlarged prostate without lower urinary tract symptoms: Secondary | ICD-10-CM

## 2019-08-14 DIAGNOSIS — I10 Essential (primary) hypertension: Secondary | ICD-10-CM | POA: Diagnosis not present

## 2019-08-14 DIAGNOSIS — Z0000 Encounter for general adult medical examination without abnormal findings: Secondary | ICD-10-CM | POA: Diagnosis not present

## 2019-08-14 DIAGNOSIS — C9110 Chronic lymphocytic leukemia of B-cell type not having achieved remission: Secondary | ICD-10-CM | POA: Diagnosis not present

## 2019-08-14 DIAGNOSIS — E785 Hyperlipidemia, unspecified: Secondary | ICD-10-CM

## 2019-08-14 LAB — HEPATIC FUNCTION PANEL
ALT: 15 U/L (ref 0–53)
AST: 20 U/L (ref 0–37)
Albumin: 4.7 g/dL (ref 3.5–5.2)
Alkaline Phosphatase: 53 U/L (ref 39–117)
Bilirubin, Direct: 0.2 mg/dL (ref 0.0–0.3)
Total Bilirubin: 1.3 mg/dL — ABNORMAL HIGH (ref 0.2–1.2)
Total Protein: 7.1 g/dL (ref 6.0–8.3)

## 2019-08-14 LAB — CBC WITH DIFFERENTIAL/PLATELET
Basophils Absolute: 0 10*3/uL (ref 0.0–0.1)
Basophils Relative: 0.3 % (ref 0.0–3.0)
Eosinophils Absolute: 0.2 10*3/uL (ref 0.0–0.7)
Eosinophils Relative: 2.4 % (ref 0.0–5.0)
HCT: 51 % (ref 39.0–52.0)
Hemoglobin: 17.2 g/dL — ABNORMAL HIGH (ref 13.0–17.0)
Lymphocytes Relative: 44.9 % (ref 12.0–46.0)
Lymphs Abs: 3.8 10*3/uL (ref 0.7–4.0)
MCHC: 33.7 g/dL (ref 30.0–36.0)
MCV: 95.4 fl (ref 78.0–100.0)
Monocytes Absolute: 0.6 10*3/uL (ref 0.1–1.0)
Monocytes Relative: 6.7 % (ref 3.0–12.0)
Neutro Abs: 3.9 10*3/uL (ref 1.4–7.7)
Neutrophils Relative %: 45.7 % (ref 43.0–77.0)
Platelets: 215 10*3/uL (ref 150.0–400.0)
RBC: 5.34 Mil/uL (ref 4.22–5.81)
RDW: 13.5 % (ref 11.5–15.5)
WBC: 8.6 10*3/uL (ref 4.0–10.5)

## 2019-08-14 LAB — BASIC METABOLIC PANEL
BUN: 18 mg/dL (ref 6–23)
CO2: 27 mEq/L (ref 19–32)
Calcium: 9.6 mg/dL (ref 8.4–10.5)
Chloride: 103 mEq/L (ref 96–112)
Creatinine, Ser: 1.11 mg/dL (ref 0.40–1.50)
GFR: 64.48 mL/min (ref >60.00)
Glucose, Bld: 89 mg/dL (ref 70–99)
Potassium: 4.8 mEq/L (ref 3.5–5.1)
Sodium: 138 mEq/L (ref 135–145)

## 2019-08-14 LAB — LIPID PANEL
Cholesterol: 187 mg/dL (ref 0–200)
HDL: 38.9 mg/dL — ABNORMAL LOW (ref >39.00)
LDL Cholesterol: 117 mg/dL — ABNORMAL HIGH (ref 0–99)
NonHDL: 148.27
Total CHOL/HDL Ratio: 5
Triglycerides: 155 mg/dL — ABNORMAL HIGH (ref 0.0–149.0)
VLDL: 31 mg/dL (ref 0.0–40.0)

## 2019-08-14 LAB — TSH: TSH: 1.84 u[IU]/mL (ref 0.35–4.50)

## 2019-08-14 LAB — PSA: PSA: 0.7 ng/mL (ref 0.10–4.00)

## 2019-08-14 LAB — VITAMIN D 25 HYDROXY (VIT D DEFICIENCY, FRACTURES): VITD: 48.38 ng/mL (ref 30.00–100.00)

## 2019-08-14 NOTE — Patient Instructions (Signed)
Continue doing brain stimulating activities (puzzles, reading, adult coloring books, staying active) to keep memory sharp.   Continue to eat heart healthy diet (full of fruits, vegetables, whole grains, lean protein, water--limit salt, fat, and sugar intake) and increase physical activity as tolerated.  Marland Kitchen

## 2019-08-14 NOTE — Patient Instructions (Signed)

## 2019-08-14 NOTE — Progress Notes (Signed)
Subjective:  Patient ID: Larry Huang, male    DOB: 07/05/1946  Age: 73 y.o. MRN: [id number]  CC: Annual Exam, Hypertension, and Hyperlipidemia   HPI Larry Huang presents for a CPX.  He is very active and denies any recent episodes of palpitations, dizziness, lightheadedness, chest pain, shortness of breath, DOE, edema, or fatigue.   Outpatient Medications Prior to Visit  Medication Sig Dispense Refill  . aspirin EC 81 MG tablet Take 81 mg by mouth daily.      . Cholecalciferol (CVS D3) 50 MCG (2000 UT) CAPS Take 1 capsule (2,000 Units total) by mouth daily. 180 capsule 1  . pravastatin (PRAVACHOL) 40 MG tablet TAKE 1 TABLET BY MOUTH EVERY DAY 90 tablet 1   No facility-administered medications prior to visit.     ROS Review of Systems  Constitutional: Negative.  Negative for diaphoresis, fatigue and fever.  Eyes: Negative for visual disturbance.  Respiratory: Negative for cough, chest tightness, shortness of breath and wheezing.   Cardiovascular: Negative for chest pain, palpitations and leg swelling.  Gastrointestinal: Negative for abdominal pain, constipation, diarrhea, nausea and vomiting.  Endocrine: Negative.   Genitourinary: Negative.  Negative for difficulty urinating, scrotal swelling, testicular pain and urgency.  Musculoskeletal: Negative for arthralgias and myalgias.  Skin: Negative for pallor.  Neurological: Negative.  Negative for dizziness, weakness and light-headedness.  Hematological: Negative for adenopathy. Does not bruise/bleed easily.  Psychiatric/Behavioral: Negative.     Objective:  BP 138/88 (BP Location: Left Arm, Patient Position: Sitting, Cuff Size: Normal)   Pulse (!) 56   Temp 98.1 F (36.7 C) (Oral)   Resp 16   Ht 5\' 11"  (1.803 m)   Wt 175 lb 8 oz (79.6 kg)   SpO2 98%   BMI 24.48 kg/m   BP Readings from Last 3 Encounters:  08/14/19 138/88  08/14/19 138/88  12/05/18 (!) 146/82    Wt Readings from Last 3 Encounters:  08/14/19  175 lb (79.4 kg)  08/14/19 175 lb 8 oz (79.6 kg)  12/05/18 176 lb 4 oz (79.9 kg)    Physical Exam Vitals signs reviewed.  Constitutional:      Appearance: Normal appearance.  HENT:     Nose: Nose normal.     Mouth/Throat:     Mouth: Mucous membranes are moist.  Eyes:     General: No scleral icterus.    Conjunctiva/sclera: Conjunctivae normal.  Neck:     Musculoskeletal: Neck supple.  Cardiovascular:     Rate and Rhythm: Normal rate and regular rhythm.     Heart sounds: No murmur.     Comments: EKG ----  Sinus  Bradycardia  WITHIN NORMAL LIMITS  Pulmonary:     Effort: Pulmonary effort is normal.     Breath sounds: No wheezing, rhonchi or rales.  Abdominal:     General: Abdomen is flat. Bowel sounds are normal. There is no distension.     Palpations: Abdomen is soft. There is no hepatomegaly or splenomegaly.     Tenderness: There is no abdominal tenderness.     Hernia: There is no hernia in the left inguinal area or right inguinal area.  Genitourinary:    Pubic Area: No rash.      Penis: Normal and circumcised. No discharge, swelling or lesions.      Scrotum/Testes: Normal.        Right: Mass or tenderness not present.        Left: Mass or tenderness not present.  Epididymis:     Right: Normal. Not inflamed or enlarged. No mass.     Left: Not inflamed or enlarged. No mass.     Prostate: Enlarged (1+ smooth symm BPH). Not tender and no nodules present.     Rectum: Normal. Guaiac result negative. No mass, tenderness, anal fissure, external hemorrhoid or internal hemorrhoid. Normal anal tone.  Musculoskeletal: Normal range of motion.     Right lower leg: No edema.     Left lower leg: No edema.  Lymphadenopathy:     Cervical: No cervical adenopathy.     Lower Body: No right inguinal adenopathy. No left inguinal adenopathy.  Skin:    General: Skin is warm and dry.     Coloration: Skin is not pale.  Neurological:     General: No focal deficit present.     Mental  Status: He is alert.     Lab Results  Component Value Date   WBC 8.6 08/14/2019   HGB 17.2 (H) 08/14/2019   HCT 51.0 08/14/2019   PLT 215.0 08/14/2019   GLUCOSE 89 08/14/2019   CHOL 187 08/14/2019   TRIG 155.0 (H) 08/14/2019   HDL 38.90 (L) 08/14/2019   LDLCALC 117 (H) 08/14/2019   ALT 15 08/14/2019   AST 20 08/14/2019   NA 138 08/14/2019   K 4.8 08/14/2019   CL 103 08/14/2019   CREATININE 1.11 08/14/2019   BUN 18 08/14/2019   CO2 27 08/14/2019   TSH 1.84 08/14/2019   PSA 0.70 08/14/2019    No results found.  Assessment & Plan:   Larry Huang was seen today for annual exam, hypertension and hyperlipidemia.  Diagnoses and all orders for this visit:  Benign prostatic hyperplasia without lower urinary tract symptoms- His PSA is low which is reassuring that he does not have prostate cancer.  He has no symptoms that need to be treated. -     PSA; Future  Essential hypertension, benign- His blood pressure is adequately well controlled with lifestyle modifications.  Medical therapy is not indicated. -     CBC with Differential; Future -     Basic metabolic panel; Future -     EKG 12-Lead  Chronic lymphocytic leukemia ([place])- This continues to be in remission. -     CBC with Differential; Future  Hyperlipidemia with target LDL less than 130- He has achieved his LDL goal and is doing well on the statin. -     Lipid panel; Future -     TSH; Future -     Hepatic function panel; Future  Routine general medical examination at a health care facility- Exam completed, labs reviewed, vaccines reviewed and updated, cancer screenings are all up-to-date, patient education was given.  Vitamin D deficiency disease- His vitamin D level is normal now. -     Vitamin D 25 hydroxy; Future   I am having Larry Huang maintain his aspirin EC, Cholecalciferol, and pravastatin.  No orders of the defined types were placed in this encounter.    Follow-up: Return in about 6 months (around  02/11/2020).  [name], MD

## 2019-10-14 ENCOUNTER — Other Ambulatory Visit: Payer: Self-pay | Admitting: Internal Medicine

## 2019-10-14 DIAGNOSIS — E785 Hyperlipidemia, unspecified: Secondary | ICD-10-CM

## 2019-12-24 DIAGNOSIS — Z7982 Long term (current) use of aspirin: Secondary | ICD-10-CM | POA: Diagnosis not present

## 2019-12-24 DIAGNOSIS — R030 Elevated blood-pressure reading, without diagnosis of hypertension: Secondary | ICD-10-CM | POA: Diagnosis not present

## 2019-12-24 DIAGNOSIS — E785 Hyperlipidemia, unspecified: Secondary | ICD-10-CM | POA: Diagnosis not present

## 2019-12-24 DIAGNOSIS — Z87891 Personal history of nicotine dependence: Secondary | ICD-10-CM | POA: Diagnosis not present

## 2019-12-24 DIAGNOSIS — G63 Polyneuropathy in diseases classified elsewhere: Secondary | ICD-10-CM | POA: Diagnosis not present

## 2019-12-24 DIAGNOSIS — C9110 Chronic lymphocytic leukemia of B-cell type not having achieved remission: Secondary | ICD-10-CM | POA: Diagnosis not present

## 2019-12-24 DIAGNOSIS — Z7722 Contact with and (suspected) exposure to environmental tobacco smoke (acute) (chronic): Secondary | ICD-10-CM | POA: Diagnosis not present

## 2020-03-26 ENCOUNTER — Other Ambulatory Visit: Payer: Self-pay | Admitting: Internal Medicine

## 2020-03-26 DIAGNOSIS — E785 Hyperlipidemia, unspecified: Secondary | ICD-10-CM

## 2020-04-02 ENCOUNTER — Ambulatory Visit: Payer: Medicare PPO | Attending: Internal Medicine

## 2020-04-02 DIAGNOSIS — Z20822 Contact with and (suspected) exposure to covid-19: Secondary | ICD-10-CM

## 2020-04-03 LAB — NOVEL CORONAVIRUS, NAA: SARS-CoV-2, NAA: NOT DETECTED

## 2020-04-03 LAB — SARS-COV-2, NAA 2 DAY TAT

## 2020-08-14 ENCOUNTER — Ambulatory Visit (INDEPENDENT_AMBULATORY_CARE_PROVIDER_SITE_OTHER): Payer: Medicare PPO | Admitting: Internal Medicine

## 2020-08-14 ENCOUNTER — Other Ambulatory Visit: Payer: Self-pay

## 2020-08-14 ENCOUNTER — Encounter: Payer: Self-pay | Admitting: Internal Medicine

## 2020-08-14 VITALS — BP 128/84 | HR 64 | Temp 97.9°F | Resp 16 | Ht 71.0 in | Wt 176.0 lb

## 2020-08-14 DIAGNOSIS — Z0000 Encounter for general adult medical examination without abnormal findings: Secondary | ICD-10-CM

## 2020-08-14 DIAGNOSIS — I10 Essential (primary) hypertension: Secondary | ICD-10-CM

## 2020-08-14 DIAGNOSIS — C9110 Chronic lymphocytic leukemia of B-cell type not having achieved remission: Secondary | ICD-10-CM

## 2020-08-14 DIAGNOSIS — N1831 Chronic kidney disease, stage 3a: Secondary | ICD-10-CM | POA: Diagnosis not present

## 2020-08-14 DIAGNOSIS — N400 Enlarged prostate without lower urinary tract symptoms: Secondary | ICD-10-CM

## 2020-08-14 DIAGNOSIS — Z23 Encounter for immunization: Secondary | ICD-10-CM | POA: Insufficient documentation

## 2020-08-14 DIAGNOSIS — R001 Bradycardia, unspecified: Secondary | ICD-10-CM | POA: Diagnosis not present

## 2020-08-14 DIAGNOSIS — K219 Gastro-esophageal reflux disease without esophagitis: Secondary | ICD-10-CM

## 2020-08-14 DIAGNOSIS — L602 Onychogryphosis: Secondary | ICD-10-CM | POA: Insufficient documentation

## 2020-08-14 DIAGNOSIS — E785 Hyperlipidemia, unspecified: Secondary | ICD-10-CM | POA: Diagnosis not present

## 2020-08-14 LAB — CBC WITH DIFFERENTIAL/PLATELET
Basophils Absolute: 0 10*3/uL (ref 0.0–0.1)
Basophils Relative: 0.4 % (ref 0.0–3.0)
Eosinophils Absolute: 0.3 10*3/uL (ref 0.0–0.7)
Eosinophils Relative: 3.8 % (ref 0.0–5.0)
HCT: 46.9 % (ref 39.0–52.0)
Hemoglobin: 15.9 g/dL (ref 13.0–17.0)
Lymphocytes Relative: 38.8 % (ref 12.0–46.0)
Lymphs Abs: 2.9 10*3/uL (ref 0.7–4.0)
MCHC: 34 g/dL (ref 30.0–36.0)
MCV: 93.9 fl (ref 78.0–100.0)
Monocytes Absolute: 0.6 10*3/uL (ref 0.1–1.0)
Monocytes Relative: 7.8 % (ref 3.0–12.0)
Neutro Abs: 3.7 10*3/uL (ref 1.4–7.7)
Neutrophils Relative %: 49.2 % (ref 43.0–77.0)
Platelets: 204 10*3/uL (ref 150.0–400.0)
RBC: 5 Mil/uL (ref 4.22–5.81)
RDW: 13.8 % (ref 11.5–15.5)
WBC: 7.5 10*3/uL (ref 4.0–10.5)

## 2020-08-14 LAB — BASIC METABOLIC PANEL
BUN: 17 mg/dL (ref 6–23)
CO2: 28 mEq/L (ref 19–32)
Calcium: 9.2 mg/dL (ref 8.4–10.5)
Chloride: 105 mEq/L (ref 96–112)
Creatinine, Ser: 1.21 mg/dL (ref 0.40–1.50)
GFR: 58.13 mL/min — ABNORMAL LOW (ref >60.00)
Glucose, Bld: 82 mg/dL (ref 70–99)
Potassium: 4.2 mEq/L (ref 3.5–5.1)
Sodium: 137 mEq/L (ref 135–145)

## 2020-08-14 LAB — LIPID PANEL
Cholesterol: 161 mg/dL (ref 0–200)
HDL: 37.8 mg/dL — ABNORMAL LOW (ref >39.00)
LDL Cholesterol: 91 mg/dL (ref 0–99)
NonHDL: 123.03
Total CHOL/HDL Ratio: 4
Triglycerides: 158 mg/dL — ABNORMAL HIGH (ref 0.0–149.0)
VLDL: 31.6 mg/dL (ref 0.0–40.0)

## 2020-08-14 LAB — PSA: PSA: 0.91 ng/mL (ref 0.10–4.00)

## 2020-08-14 LAB — HEPATIC FUNCTION PANEL
ALT: 15 U/L (ref 0–53)
AST: 21 U/L (ref 0–37)
Albumin: 4.2 g/dL (ref 3.5–5.2)
Alkaline Phosphatase: 46 U/L (ref 39–117)
Bilirubin, Direct: 0.2 mg/dL (ref 0.0–0.3)
Total Bilirubin: 1.1 mg/dL (ref 0.2–1.2)
Total Protein: 6.7 g/dL (ref 6.0–8.3)

## 2020-08-14 LAB — TSH: TSH: 2.08 u[IU]/mL (ref 0.35–4.50)

## 2020-08-14 NOTE — Progress Notes (Signed)
Subjective:  Patient ID: Larry Huang, male    DOB: [birth year]  Age: 73 y.o. MRN: [id number]  CC: Annual Exam  This visit occurred during the SARS-CoV-2 public health emergency.  Safety protocols were in place, including screening questions prior to the visit, additional usage of staff PPE, and extensive cleaning of exam room while observing appropriate contact time as indicated for disinfecting solutions.    HPI Larry Huang presents for a CPX.  He walks 2 miles/day.  He denies CP, DOE, palpitations, edema, or fatigue.  He is tolerating the statin well with no muscle or joint aches.  He is concerned about the length of his toenails and would like to see a podiatrist.  He denies fever, chills, lymphadenopathy, abdominal pain, or night sweats.  Outpatient Medications Prior to Visit  Medication Sig Dispense Refill  . aspirin EC 81 MG tablet Take 81 mg by mouth daily.      . Cholecalciferol ([employer] D3) 50 MCG (2000 UT) CAPS Take 1 capsule (2,000 Units total) by mouth daily. 180 capsule 1  . pravastatin (PRAVACHOL) 40 MG tablet TAKE 1 TABLET BY MOUTH EVERY DAY 90 tablet 1   No facility-administered medications prior to visit.    ROS Review of Systems  Constitutional: Negative for appetite change, diaphoresis and fatigue.  HENT: Negative.   Eyes: Negative for visual disturbance.  Respiratory: Negative for cough, chest tightness, shortness of breath and wheezing.   Cardiovascular: Negative for chest pain, palpitations and leg swelling.  Gastrointestinal: Negative for abdominal pain, constipation, diarrhea, nausea and vomiting.  Endocrine: Negative.   Genitourinary: Negative.  Negative for difficulty urinating.  Musculoskeletal: Negative for arthralgias and myalgias.  Skin: Negative for color change and pallor.  Neurological: Negative.  Negative for dizziness, weakness, light-headedness and numbness.  Hematological: Negative for adenopathy. Does not bruise/bleed easily.   Psychiatric/Behavioral: Negative.     Objective:  BP 128/84   Pulse 64   Temp 97.9 F (36.6 C) (Oral)   Resp 16   Ht 5\' 11"  (1.803 m)   Wt 176 lb (79.8 kg)   SpO2 97%   BMI 24.55 kg/m   BP Readings from Last 3 Encounters:  08/14/20 128/84  08/14/19 138/88  08/14/19 138/88    Wt Readings from Last 3 Encounters:  08/14/20 176 lb (79.8 kg)  08/14/19 175 lb (79.4 kg)  08/14/19 175 lb 8 oz (79.6 kg)    Physical Exam Vitals reviewed.  Constitutional:      Appearance: Normal appearance.  HENT:     Nose: Nose normal.     Mouth/Throat:     Mouth: Mucous membranes are moist.  Eyes:     General: No scleral icterus.    Conjunctiva/sclera: Conjunctivae normal.  Cardiovascular:     Rate and Rhythm: Normal rate and regular rhythm.     Pulses:          Dorsalis pedis pulses are 1+ on the right side and 1+ on the left side.       Posterior tibial pulses are 1+ on the right side and 1+ on the left side.     Heart sounds: No murmur heard.   Pulmonary:     Effort: Pulmonary effort is normal.     Breath sounds: No stridor. No wheezing, rhonchi or rales.  Abdominal:     General: Abdomen is flat. Bowel sounds are normal. There is no distension.     Palpations: Abdomen is soft. There is no hepatomegaly, splenomegaly or mass.  Tenderness: There is no abdominal tenderness.     Hernia: No hernia is present.  Musculoskeletal:        General: Normal range of motion.     Cervical back: Neck supple.     Right lower leg: No edema.     Left lower leg: No edema.  Feet:     Right foot:     Skin integrity: Skin integrity normal.     Toenail Condition: Right toenails are abnormally thick and long. Fungal disease present.    Left foot:     Skin integrity: Skin integrity normal.     Toenail Condition: Left toenails are abnormally thick and long. Fungal disease present. Lymphadenopathy:     Cervical: No cervical adenopathy.  Skin:    General: Skin is warm and dry.     Coloration:  Skin is not pale.     Findings: No rash.  Neurological:     General: No focal deficit present.     Mental Status: He is alert and oriented to person, place, and time. Mental status is at baseline.  Psychiatric:        Mood and Affect: Mood normal.        Behavior: Behavior normal.     Lab Results  Component Value Date   WBC 7.5 08/14/2020   HGB 15.9 08/14/2020   HCT 46.9 08/14/2020   PLT 204.0 08/14/2020   GLUCOSE 82 08/14/2020   CHOL 161 08/14/2020   TRIG 158.0 (H) 08/14/2020   HDL 37.80 (L) 08/14/2020   LDLCALC 91 08/14/2020   ALT 15 08/14/2020   AST 21 08/14/2020   NA 137 08/14/2020   K 4.2 08/14/2020   CL 105 08/14/2020   CREATININE 1.21 08/14/2020   BUN 17 08/14/2020   CO2 28 08/14/2020   TSH 2.08 08/14/2020   PSA 0.91 08/14/2020    No results found.  Assessment & Plan:   Itzel was seen today for annual exam.  Diagnoses and all orders for this visit:  Essential hypertension, benign- His blood pressure is adequately well controlled with lifestyle modifications.  Labs are negative for secondary causes or endorgan damage. -     Basic metabolic panel; Future -     TSH; Future -     CBC with Differential/Platelet; Future -     CBC with Differential/Platelet -     TSH -     Basic metabolic panel  Gastroesophageal reflux disease without esophagitis  Benign prostatic hyperplasia without lower urinary tract symptoms- His PSA is low which is a reassuring sign that he does not have prostate cancer. -     PSA; Future -     PSA  Bradycardia- His heart rate is normal now and he is asymptomatic with respect to this. -     TSH; Future -     TSH  Chronic lymphocytic leukemia ([place])- His CBC is normal which is a reassuring sign that there is no evidence of recurrence. -     CBC with Differential/Platelet; Future -     CBC with Differential/Platelet  Hyperlipidemia with target LDL less than 130- He has achieved his LDL goal and is doing well on the statin. -      Lipid panel; Future -     Hepatic function panel; Future -     Hepatic function panel -     Lipid panel  Routine general medical examination at a health care facility- Exam completed, labs reviewed, vaccines reviewed and updated, cancer  screenings are up-to-date, patient education was given.  Flu vaccine need -     Flu Vaccine QUAD High Dose(Fluad)  Overgrown toenails -     Ambulatory referral to Podiatry  Stage 3a chronic kidney disease ([place])- His blood pressure is adequately well controlled.  He will avoid nephrotoxic agents.   I am having [place] "Tal" maintain his aspirin EC, Cholecalciferol, and pravastatin.  No orders of the defined types were placed in this encounter.    Follow-up: Return in about 6 months (around 02/11/2021).  [name], MD

## 2020-08-14 NOTE — Patient Instructions (Signed)

## 2020-08-15 DIAGNOSIS — N1831 Chronic kidney disease, stage 3a: Secondary | ICD-10-CM | POA: Insufficient documentation

## 2020-09-09 ENCOUNTER — Other Ambulatory Visit: Payer: Self-pay | Admitting: Internal Medicine

## 2020-09-09 DIAGNOSIS — E785 Hyperlipidemia, unspecified: Secondary | ICD-10-CM

## 2020-09-10 ENCOUNTER — Encounter: Payer: Self-pay | Admitting: Podiatry

## 2020-09-10 ENCOUNTER — Ambulatory Visit: Payer: Medicare PPO | Admitting: Podiatry

## 2020-09-10 ENCOUNTER — Other Ambulatory Visit: Payer: Self-pay

## 2020-09-10 DIAGNOSIS — M79674 Pain in right toe(s): Secondary | ICD-10-CM

## 2020-09-10 DIAGNOSIS — B351 Tinea unguium: Secondary | ICD-10-CM | POA: Diagnosis not present

## 2020-09-10 DIAGNOSIS — M79675 Pain in left toe(s): Secondary | ICD-10-CM | POA: Diagnosis not present

## 2020-09-10 NOTE — Progress Notes (Signed)
  Subjective:  Patient ID: Larry Huang, male    DOB: [birth year],  MRN: [id number]  Chief Complaint  Patient presents with  . routine foot care    Nail trim    74 y.o. male returns for the above complaint.  Patient presents with thickened elongated dystrophic toenails x10.  Patient would like to have them debrided down.  He is notably doing himself.  He denies any other acute complaints.  Objective:  There were no vitals filed for this visit. Podiatric Exam: Vascular: dorsalis pedis and posterior tibial pulses are palpable bilateral. Capillary return is immediate. Temperature gradient is WNL. Skin turgor WNL  Sensorium: Normal Semmes Weinstein monofilament test. Normal tactile sensation bilaterally. Nail Exam: Pt has thick disfigured discolored nails with subungual debris noted bilateral entire nail hallux through fifth toenails.  Pain on palpation to the nails. Ulcer Exam: There is no evidence of ulcer or pre-ulcerative changes or infection. Orthopedic Exam: Muscle tone and strength are WNL. No limitations in general ROM. No crepitus or effusions noted. HAV  B/L.  Hammer toes 2-5  B/L. Skin: No Porokeratosis. No infection or ulcers    Assessment & Plan:   1. Pain due to onychomycosis of toenails of both feet     Patient was evaluated and treated and all questions answered.  Onychomycosis with pain  -Nails palliatively debrided as below. -Educated on self-care  Procedure: Nail Debridement Rationale: pain  Type of Debridement: manual, sharp debridement. Instrumentation: Nail nipper, rotary burr. Number of Nails: 10  Procedures and Treatment: Consent by patient was obtained for treatment procedures. The patient understood the discussion of treatment and procedures well. All questions were answered thoroughly reviewed. Debridement of mycotic and hypertrophic toenails, 1 through 5 bilateral and clearing of subungual debris. No ulceration, no infection noted.  Return Visit-Office  Procedure: Patient instructed to return to the office for a follow up visit 3 months for continued evaluation and treatment.  Boneta Lucks, DPM    No follow-ups on file.

## 2020-12-22 ENCOUNTER — Ambulatory Visit (INDEPENDENT_AMBULATORY_CARE_PROVIDER_SITE_OTHER): Payer: Medicare PPO

## 2020-12-22 ENCOUNTER — Other Ambulatory Visit: Payer: Self-pay

## 2020-12-22 VITALS — BP 122/70 | HR 57 | Temp 98.3°F | Ht 71.0 in | Wt 174.8 lb

## 2020-12-22 DIAGNOSIS — Z0000 Encounter for general adult medical examination without abnormal findings: Secondary | ICD-10-CM

## 2020-12-22 NOTE — Patient Instructions (Signed)
Larry Huang , Thank you for taking time to come for your Medicare Wellness Visit. I appreciate your ongoing commitment to your health goals. Please review the following plan we discussed and let me know if I can assist you in the future.   Screening recommendations/referrals: Colonoscopy: not a candidate for colon cancer screening due to age Recommended yearly ophthalmology/optometry visit for glaucoma screening and checkup Recommended yearly dental visit for hygiene and checkup  Vaccinations: Influenza vaccine: 08/14/2020 Pneumococcal vaccine: 08/05/2015, 08/05/2016 Tdap vaccine: 08/10/2018 Shingles vaccine: never done   Covid-19: 10/09/2019, 10/30/2019, 07/01/2020  Advanced directives: Please bring a copy of your health care power of attorney and living will to the office at your convenience.  Conditions/risks identified: Yes; Reviewed health maintenance screenings with patient today and relevant education, vaccines, and/or referrals were provided. Please continue to do your personal lifestyle choices by: daily care of teeth and gums, regular physical activity (goal should be 5 days a week for 30 minutes), eat a healthy diet, avoid tobacco and drug use, limiting any alcohol intake, taking a low-dose aspirin (if not allergic or have been advised by your provider otherwise) and taking vitamins and minerals as recommended by your provider. Continue doing brain stimulating activities (puzzles, reading, adult coloring books, staying active) to keep memory sharp. Continue to eat heart healthy diet (full of fruits, vegetables, whole grains, lean protein, water--limit salt, fat, and sugar intake) and increase physical activity as tolerated.  Next appointment: Please schedule your next Medicare Wellness Visit with your Nurse Health Advisor in 1 year by calling [phone]. Preventive Care 77 Years and Older, Male Preventive care refers to lifestyle choices and visits with your health care provider that can  promote health and wellness. What does preventive care include?  A yearly physical exam. This is also called an annual well check.  Dental exams once or twice a year.  Routine eye exams. Ask your health care provider how often you should have your eyes checked.  Personal lifestyle choices, including:  Daily care of your teeth and gums.  Regular physical activity.  Eating a healthy diet.  Avoiding tobacco and drug use.  Limiting alcohol use.  Practicing safe sex.  Taking low doses of aspirin every day.  Taking vitamin and mineral supplements as recommended by your health care provider. What happens during an annual well check? The services and screenings done by your health care provider during your annual well check will depend on your age, overall health, lifestyle risk factors, and family history of disease. Counseling  Your health care provider may ask you questions about your:  Alcohol use.  Tobacco use.  Drug use.  Emotional well-being.  Home and relationship well-being.  Sexual activity.  Eating habits.  History of falls.  Memory and ability to understand (cognition).  Work and work Statistician. Screening  You may have the following tests or measurements:  Height, weight, and BMI.  Blood pressure.  Lipid and cholesterol levels. These may be checked every 5 years, or more frequently if you are over 30 years old.  Skin check.  Lung cancer screening. You may have this screening every year starting at age 77 if you have a 30-pack-year history of smoking and currently smoke or have quit within the past 15 years.  Fecal occult blood test (FOBT) of the stool. You may have this test every year starting at age 77.  Flexible sigmoidoscopy or colonoscopy. You may have a sigmoidoscopy every 5 years or a colonoscopy every 10 years starting  at age 77.  Prostate cancer screening. Recommendations will vary depending on your family history and other  risks.  Hepatitis C blood test.  Hepatitis B blood test.  Sexually transmitted disease (STD) testing.  Diabetes screening. This is done by checking your blood sugar (glucose) after you have not eaten for a while (fasting). You may have this done every 1-3 years.  Abdominal aortic aneurysm (AAA) screening. You may need this if you are a current or former smoker.  Osteoporosis. You may be screened starting at age 24 if you are at high risk. Talk with your health care provider about your test results, treatment options, and if necessary, the need for more tests. Vaccines  Your health care provider may recommend certain vaccines, such as:  Influenza vaccine. This is recommended every year.  Tetanus, diphtheria, and acellular pertussis (Tdap, Td) vaccine. You may need a Td booster every 10 years.  Zoster vaccine. You may need this after age 2.  Pneumococcal 13-valent conjugate (PCV13) vaccine. One dose is recommended after age 28.  Pneumococcal polysaccharide (PPSV23) vaccine. One dose is recommended after age 41. Talk to your health care provider about which screenings and vaccines you need and how often you need them. This information is not intended to replace advice given to you by your health care provider. Make sure you discuss any questions you have with your health care provider. Document Released: 10/10/2015 Document Revised: 06/02/2016 Document Reviewed: 07/15/2015 Elsevier Interactive Patient Education  2017 [employer] Prevention in the Home Falls can cause injuries. They can happen to people of all ages. There are many things you can do to make your home safe and to help prevent falls. What can I do on the outside of my home?  Regularly fix the edges of walkways and driveways and fix any cracks.  Remove anything that might make you trip as you walk through a door, such as a raised step or threshold.  Trim any bushes or trees on the path to your home.  Use  bright outdoor lighting.  Clear any walking paths of anything that might make someone trip, such as rocks or tools.  Regularly check to see if handrails are loose or broken. Make sure that both sides of any steps have handrails.  Any raised decks and porches should have guardrails on the edges.  Have any leaves, snow, or ice cleared regularly.  Use sand or salt on walking paths during winter.  Clean up any spills in your garage right away. This includes oil or grease spills. What can I do in the bathroom?  Use night lights.  Install grab bars by the toilet and in the tub and shower. Do not use towel bars as grab bars.  Use non-skid mats or decals in the tub or shower.  If you need to sit down in the shower, use a plastic, non-slip stool.  Keep the floor dry. Clean up any water that spills on the floor as soon as it happens.  Remove soap buildup in the tub or shower regularly.  Attach bath mats securely with double-sided non-slip rug tape.  Do not have throw rugs and other things on the floor that can make you trip. What can I do in the bedroom?  Use night lights.  Make sure that you have a light by your bed that is easy to reach.  Do not use any sheets or blankets that are too big for your bed. They should not hang down onto  the floor.  Have a firm chair that has side arms. You can use this for support while you get dressed.  Do not have throw rugs and other things on the floor that can make you trip. What can I do in the kitchen?  Clean up any spills right away.  Avoid walking on wet floors.  Keep items that you use a lot in easy-to-reach places.  If you need to reach something above you, use a strong step stool that has a grab bar.  Keep electrical cords out of the way.  Do not use floor polish or wax that makes floors slippery. If you must use wax, use non-skid floor wax.  Do not have throw rugs and other things on the floor that can make you trip. What can  I do with my stairs?  Do not leave any items on the stairs.  Make sure that there are handrails on both sides of the stairs and use them. Fix handrails that are broken or loose. Make sure that handrails are as long as the stairways.  Check any carpeting to make sure that it is firmly attached to the stairs. Fix any carpet that is loose or worn.  Avoid having throw rugs at the top or bottom of the stairs. If you do have throw rugs, attach them to the floor with carpet tape.  Make sure that you have a light switch at the top of the stairs and the bottom of the stairs. If you do not have them, ask someone to add them for you. What else can I do to help prevent falls?  Wear shoes that:  Do not have high heels.  Have rubber bottoms.  Are comfortable and fit you well.  Are closed at the toe. Do not wear sandals.  If you use a stepladder:  Make sure that it is fully opened. Do not climb a closed stepladder.  Make sure that both sides of the stepladder are locked into place.  Ask someone to hold it for you, if possible.  Clearly mark and make sure that you can see:  Any grab bars or handrails.  First and last steps.  Where the edge of each step is.  Use tools that help you move around (mobility aids) if they are needed. These include:  Canes.  Walkers.  Scooters.  Crutches.  Turn on the lights when you go into a dark area. Replace any light bulbs as soon as they burn out.  Set up your furniture so you have a clear path. Avoid moving your furniture around.  If any of your floors are uneven, fix them.  If there are any pets around you, be aware of where they are.  Review your medicines with your doctor. Some medicines can make you feel dizzy. This can increase your chance of falling. Ask your doctor what other things that you can do to help prevent falls. This information is not intended to replace advice given to you by your health care provider. Make sure you  discuss any questions you have with your health care provider. Document Released: 07/10/2009 Document Revised: 02/19/2016 Document Reviewed: 10/18/2014 Elsevier Interactive Patient Education  2017 [employer]. 0

## 2020-12-22 NOTE — Progress Notes (Addendum)
Subjective:   Larry Huang is a 75 y.o. male who presents for Medicare Annual/Subsequent preventive examination.  Review of Systems    No ROS. Medicare Wellness Visit. Additional risk factors are reflected in social history. Cardiac Risk Factors include: advanced age (>52men, >77 women);dyslipidemia;hypertension;male gender     Objective:    Today's Vitals   12/22/20 0830  BP: 122/70  Pulse: (!) 57  Temp: 98.3 F (36.8 C)  SpO2: 97%  Weight: 174 lb 12.8 oz (79.3 kg)  Height: 5\' 11"  (1.803 m)  PainSc: 0-No pain   Body mass index is 24.38 kg/m.  Advanced Directives 12/22/2020 08/14/2019 08/09/2016 05/13/2016 08/11/2015 05/15/2015 05/16/2014  Does Patient Have a Medical Advance Directive? Yes Yes No;Yes Yes Yes No Yes  Type of Paramedic of [place];Living will [place];Living will Home;Living will [place];Living will [place];Living will - Living will;Healthcare Power of Attorney  Does patient want to make changes to medical advance directive? No - Patient declined - No - Patient declined No - Patient declined No - Patient declined - -  Copy of [place] in Chart? No - copy requested No - copy requested Yes No - copy requested Yes - No - copy requested  Would patient like information on creating a medical advance directive? - - No - patient declined information - - No - patient declined information -    Current Medications (verified) Outpatient Encounter Medications as of 12/22/2020  Medication Sig  . aspirin EC 81 MG tablet Take 81 mg by mouth daily.  . Cholecalciferol ([employer] D3) 50 MCG (2000 UT) CAPS Take 1 capsule (2,000 Units total) by mouth daily.  . pravastatin (PRAVACHOL) 40 MG tablet TAKE 1 TABLET BY MOUTH EVERY DAY   No facility-administered encounter medications on file as of 12/22/2020.    Allergies (verified) Benazepril and Lipitor  [atorvastatin]   History: Past Medical History:  Diagnosis Date  . CLL (chronic lymphoblastic leukemia)   . Contact lens/glasses fitting    wears contacts or glasses  . GERD (gastroesophageal reflux disease)   . HTN (hypertension)   . Hyperlipidemia   . Monoclonal B-cell lymphocytosis 05/15/2015  . Snores    Past Surgical History:  Procedure Laterality Date  . COLONOSCOPY    . MICROLARYNGOSCOPY WITH CO2 LASER AND EXCISION OF VOCAL CORD LESION Right 09/04/2013   Procedure: MICROLARYNGOSCOPY WITH EXCISION OF VOCAL CORD LESION;  Surgeon: Rozetta Nunnery, MD;  Location: [facility];  Service: ENT;  Laterality: Right;  . TONSILLECTOMY     Family History  Problem Relation Age of Onset  . Cancer Father        lung ca  . Lung cancer Other   . Hypertension Other   . Cancer Other        Lung Cancer   Social History   Socioeconomic History  . Marital status: Married    Spouse name: Not on file  . Number of children: 2  . Years of education: Not on file  . Highest education level: Not on file  Occupational History  . 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 Smoking status: Never Smoker  . Smokeless tobacco: Never Used  Vaping Use  . Vaping Use: Never used  Substance and Sexual Activity  . Alcohol use: No    Comment: rare  . Drug use: No  .  Sexual activity: Yes  Other Topics Concern  . Not on file  Social History Narrative   Regular Exercise -  YES   Social Determinants of Health   Financial Resource Strain: Low Risk   . Difficulty of Paying Living Expenses: Not hard at all  Food Insecurity: No Food Insecurity  . Worried About Charity fundraiser in the Last Year: Never true  . Ran Out of Food in the Last Year: Never true  Transportation Needs: No Transportation Needs  . Lack of Transportation (Medical): No  . Lack of Transportation (Non-Medical): No  Physical Activity: Sufficiently Active  .  Days of Exercise per Week: 5 days  . Minutes of Exercise per Session: 30 min  Stress: No Stress Concern Present  . Feeling of Stress : Not at all  Social Connections: Socially Integrated  . Frequency of Communication with Friends and Family: More than three times a week  . Frequency of Social Gatherings with Friends and Family: Once a week  . Attends Religious Services: More than 4 times per year  . Active Member of Clubs or Organizations: Yes  . Attends Archivist Meetings: More than 4 times per year  . Marital Status: Married    Tobacco Counseling Counseling given: Not Answered   Clinical Intake:  Pre-visit preparation completed: Yes  Pain : No/denies pain Pain Score: 0-No pain     BMI - recorded: 24.38 Nutritional Status: BMI of 19-24  Normal Nutritional Risks: None Diabetes: No  How often do you need to have someone help you when you read instructions, pamphlets, or other written materials from your doctor or pharmacy?: 1 - Never What is the last grade level you completed in school?: Master's Degree  Diabetic? no  Interpreter Needed?: No  Information entered by :: Lisette Abu, LPN   Activities of Daily Living In your present state of health, do you have any difficulty performing the following activities: 12/22/2020 08/14/2020  Hearing? N N  Vision? N N  Difficulty concentrating or making decisions? N N  Walking or climbing stairs? N N  Dressing or bathing? N N  Doing errands, shopping? N N  Preparing Food and eating ? N -  Using the Toilet? N -  In the past six months, have you accidently leaked urine? N -  Do you have problems with loss of bowel control? N -  Managing your Medications? N -  Managing your Finances? N -  Housekeeping or managing your Housekeeping? N -  Some recent data might be hidden    Patient Care Team: Janith Lima, MD as PCP - General Heath Lark, MD as Consulting Physician (Hematology and Oncology)  Indicate any  recent Medical Services you may have received from other than [facility] providers in the past year (date may be approximate).     Assessment:   This is a routine wellness examination for Larry Huang.  Hearing/Vision screen No exam data present  Dietary issues and exercise activities discussed: Current Exercise Habits: Home exercise routine, Type of exercise: walking;stretching;strength training/weights, Time (Minutes): 30, Frequency (Times/Week): 5, Weekly Exercise (Minutes/Week): 150, Intensity: Moderate, Exercise limited by: None identified  Goals    . Patient Stated     To maintain my current health status by continuing to eat healthy, stay physically active and socially active.      Depression Screen PHQ 2/9 Scores 12/22/2020 08/14/2020 08/14/2019 08/13/2018 08/10/2018 08/09/2017 08/08/2017  PHQ - 2 Score 0 0 0 0 0 0 0  Fall Risk Fall Risk  12/22/2020 08/14/2020 08/14/2019 08/13/2018 08/10/2018  Falls in the past year? 0 0 0 0 0  Number falls in past yr: 0 - 0 - 0  Injury with Fall? 0 - 0 - 0  Risk for fall due to : No Fall Risks - - - -  Follow up Falls evaluation completed - - - Falls evaluation completed    [place]:  Any stairs in or around the home? No  If so, are there any without handrails? No  Home free of loose throw rugs in walkways, pet beds, electrical cords, etc? Yes  Adequate lighting in your home to reduce risk of falls? Yes   ASSISTIVE DEVICES UTILIZED TO PREVENT FALLS:  Life alert? No  Use of a cane, walker or w/c? No  Grab bars in the bathroom? No  Shower chair or bench in shower? No  Elevated toilet seat or a handicapped toilet? No   TIMED UP AND GO:  Was the test performed? No .  Length of time to ambulate 10 feet: 0 sec.   Gait steady and fast without use of assistive device  Cognitive Function: Normal cognitive status assessed by direct observation by this Nurse Health Advisor. No abnormalities found.        6CIT Screen 08/14/2019  What Year? 0 points  What month? 0 points  What time? 0 points  Count back from 20 0 points  Months in reverse 0 points  Repeat phrase 0 points  Total Score 0    Immunizations Immunization History  Administered Date(s) Administered  . Fluad Quad(high Dose 65+) 06/07/2019, 08/14/2020  . Influenza Split 07/19/2012  . Influenza Whole 08/31/2010  . Influenza, High Dose Seasonal PF 08/05/2016, 08/08/2017, 08/10/2018  . Influenza,inj,Quad PF,6+ Mos 06/29/2013, 06/28/2014, 08/05/2015  . PFIZER(Purple Top)SARS-COV-2 Vaccination 10/09/2019, 10/30/2019, 07/01/2020  . Pneumococcal Conjugate-13 08/05/2015  . Pneumococcal Polysaccharide-23 12/11/2008, 08/05/2016  . Td 03/27/2008  . Tdap 08/10/2018  . Zoster 11/29/2012    TDAP status: Up to date  Flu Vaccine status: Up to date  Pneumococcal vaccine status: Up to date  Covid-19 vaccine status: Completed vaccines  Qualifies for Shingles Vaccine? Yes   Zostavax completed Yes   Shingrix Completed?: No.    Education has been provided regarding the importance of this vaccine. Patient has been advised to call insurance company to determine out of pocket expense if they have not yet received this vaccine. Advised may also receive vaccine at local pharmacy or Health Dept. Verbalized acceptance and understanding.  Screening Tests Health Maintenance  Topic Date Due  . COVID-19 Vaccine (4 - Booster for Pfizer series) 12/30/2020  . TETANUS/TDAP  08/10/2028  . INFLUENZA VACCINE  Completed  . Hepatitis C Screening  Completed  . PNA vac Low Risk Adult  Completed  . HPV VACCINES  Aged Out    Health Maintenance  There are no preventive care reminders to display for this patient.  Colorectal cancer screening: Type of screening: Cologuard. Completed 10/04/2018. Repeat every 3 years  Lung Cancer Screening: (Low Dose CT Chest recommended if Age 46-80 years, 30 pack-year currently smoking OR have quit w/in 15years.) does not  qualify.   Lung Cancer Screening Referral: no  Additional Screening:  Hepatitis C Screening: does qualify; Completed yes  Vision Screening: Recommended annual ophthalmology exams for early detection of glaucoma and other disorders of the eye. Is the patient up to date with their annual eye exam?  Yes  Who is  the provider or what is the name of the office in which the patient attends annual eye exams? [facility] If pt is not established with a provider, would they like to be referred to a provider to establish care? No .   Dental Screening: Recommended annual dental exams for proper oral hygiene  Community Resource Referral / Chronic Care Management: CRR required this visit?  No   CCM required this visit?  No      Plan:     I have personally reviewed and noted the following in the patient's chart:   . Medical and social history . Use of alcohol, tobacco or illicit drugs  . Current medications and supplements . Functional ability and status . Nutritional status . Physical activity . Advanced directives . List of other physicians . Hospitalizations, surgeries, and ER visits in previous 12 months . Vitals . Screenings to include cognitive, depression, and falls . Referrals and appointments  In addition, I have reviewed and discussed with patient certain preventive protocols, quality metrics, and best practice recommendations. A written personalized care plan for preventive services as well as general preventive health recommendations were provided to patient.     Sheral Flow, LPN   04/10/9538   Nurse Notes:  Medications reviewed with patient; no opioid use noted.

## 2021-03-12 ENCOUNTER — Other Ambulatory Visit: Payer: Self-pay | Admitting: Internal Medicine

## 2021-03-12 DIAGNOSIS — E785 Hyperlipidemia, unspecified: Secondary | ICD-10-CM

## 2021-06-08 DIAGNOSIS — Z801 Family history of malignant neoplasm of trachea, bronchus and lung: Secondary | ICD-10-CM | POA: Diagnosis not present

## 2021-06-08 DIAGNOSIS — C9511 Chronic leukemia of unspecified cell type, in remission: Secondary | ICD-10-CM | POA: Diagnosis not present

## 2021-06-08 DIAGNOSIS — Z818 Family history of other mental and behavioral disorders: Secondary | ICD-10-CM | POA: Diagnosis not present

## 2021-06-08 DIAGNOSIS — E785 Hyperlipidemia, unspecified: Secondary | ICD-10-CM | POA: Diagnosis not present

## 2021-06-08 DIAGNOSIS — Z8249 Family history of ischemic heart disease and other diseases of the circulatory system: Secondary | ICD-10-CM | POA: Diagnosis not present

## 2021-06-08 DIAGNOSIS — Z7982 Long term (current) use of aspirin: Secondary | ICD-10-CM | POA: Diagnosis not present

## 2021-06-08 DIAGNOSIS — K219 Gastro-esophageal reflux disease without esophagitis: Secondary | ICD-10-CM | POA: Diagnosis not present

## 2021-06-08 DIAGNOSIS — R030 Elevated blood-pressure reading, without diagnosis of hypertension: Secondary | ICD-10-CM | POA: Diagnosis not present

## 2021-06-08 DIAGNOSIS — K5900 Constipation, unspecified: Secondary | ICD-10-CM | POA: Diagnosis not present

## 2021-08-17 ENCOUNTER — Other Ambulatory Visit: Payer: Self-pay

## 2021-08-17 ENCOUNTER — Ambulatory Visit (INDEPENDENT_AMBULATORY_CARE_PROVIDER_SITE_OTHER): Payer: Medicare PPO | Admitting: Internal Medicine

## 2021-08-17 ENCOUNTER — Encounter: Payer: Self-pay | Admitting: Internal Medicine

## 2021-08-17 VITALS — BP 142/76 | HR 51 | Temp 98.0°F | Ht 71.0 in | Wt 175.0 lb

## 2021-08-17 DIAGNOSIS — Z0000 Encounter for general adult medical examination without abnormal findings: Secondary | ICD-10-CM

## 2021-08-17 DIAGNOSIS — K219 Gastro-esophageal reflux disease without esophagitis: Secondary | ICD-10-CM

## 2021-08-17 DIAGNOSIS — E559 Vitamin D deficiency, unspecified: Secondary | ICD-10-CM

## 2021-08-17 DIAGNOSIS — I10 Essential (primary) hypertension: Secondary | ICD-10-CM

## 2021-08-17 DIAGNOSIS — Z23 Encounter for immunization: Secondary | ICD-10-CM | POA: Diagnosis not present

## 2021-08-17 DIAGNOSIS — K5904 Chronic idiopathic constipation: Secondary | ICD-10-CM | POA: Insufficient documentation

## 2021-08-17 DIAGNOSIS — E785 Hyperlipidemia, unspecified: Secondary | ICD-10-CM | POA: Diagnosis not present

## 2021-08-17 DIAGNOSIS — C9110 Chronic lymphocytic leukemia of B-cell type not having achieved remission: Secondary | ICD-10-CM

## 2021-08-17 DIAGNOSIS — R001 Bradycardia, unspecified: Secondary | ICD-10-CM

## 2021-08-17 DIAGNOSIS — N1831 Chronic kidney disease, stage 3a: Secondary | ICD-10-CM

## 2021-08-17 LAB — URINALYSIS, ROUTINE W REFLEX MICROSCOPIC
Bilirubin Urine: NEGATIVE
Hgb urine dipstick: NEGATIVE
Ketones, ur: NEGATIVE
Leukocytes,Ua: NEGATIVE
Nitrite: NEGATIVE
RBC / HPF: NONE SEEN (ref 0–?)
Specific Gravity, Urine: >=1.03 — AB (ref 1.000–1.030)
Total Protein, Urine: NEGATIVE
Urine Glucose: NEGATIVE
Urobilinogen, UA: 0.2 (ref 0.0–1.0)
pH: 5.5 (ref 5.0–8.0)

## 2021-08-17 LAB — HEPATIC FUNCTION PANEL
ALT: 17 U/L (ref 0–53)
AST: 21 U/L (ref 0–37)
Albumin: 4.5 g/dL (ref 3.5–5.2)
Alkaline Phosphatase: 51 U/L (ref 39–117)
Bilirubin, Direct: 0.2 mg/dL (ref 0.0–0.3)
Total Bilirubin: 1.2 mg/dL (ref 0.2–1.2)
Total Protein: 6.9 g/dL (ref 6.0–8.3)

## 2021-08-17 LAB — CBC WITH DIFFERENTIAL/PLATELET
Basophils Absolute: 0 10*3/uL (ref 0.0–0.1)
Basophils Relative: 0.4 % (ref 0.0–3.0)
Eosinophils Absolute: 0.2 10*3/uL (ref 0.0–0.7)
Eosinophils Relative: 2.8 % (ref 0.0–5.0)
HCT: 46.9 % (ref 39.0–52.0)
Hemoglobin: 15.9 g/dL (ref 13.0–17.0)
Lymphocytes Relative: 32.5 % (ref 12.0–46.0)
Lymphs Abs: 2.6 10*3/uL (ref 0.7–4.0)
MCHC: 33.9 g/dL (ref 30.0–36.0)
MCV: 95.3 fl (ref 78.0–100.0)
Monocytes Absolute: 0.4 10*3/uL (ref 0.1–1.0)
Monocytes Relative: 5.7 % (ref 3.0–12.0)
Neutro Abs: 4.6 10*3/uL (ref 1.4–7.7)
Neutrophils Relative %: 58.6 % (ref 43.0–77.0)
Platelets: 224 10*3/uL (ref 150.0–400.0)
RBC: 4.92 Mil/uL (ref 4.22–5.81)
RDW: 13.5 % (ref 11.5–15.5)
WBC: 7.9 10*3/uL (ref 4.0–10.5)

## 2021-08-17 LAB — LIPID PANEL
Cholesterol: 162 mg/dL (ref 0–200)
HDL: 42.2 mg/dL (ref >39.00)
LDL Cholesterol: 99 mg/dL (ref 0–99)
NonHDL: 119.94
Total CHOL/HDL Ratio: 4
Triglycerides: 103 mg/dL (ref 0.0–149.0)
VLDL: 20.6 mg/dL (ref 0.0–40.0)

## 2021-08-17 LAB — BASIC METABOLIC PANEL
BUN: 17 mg/dL (ref 6–23)
CO2: 29 mEq/L (ref 19–32)
Calcium: 9.5 mg/dL (ref 8.4–10.5)
Chloride: 104 mEq/L (ref 96–112)
Creatinine, Ser: 1.14 mg/dL (ref 0.40–1.50)
GFR: 62 mL/min (ref >60.00)
Glucose, Bld: 91 mg/dL (ref 70–99)
Potassium: 4.3 mEq/L (ref 3.5–5.1)
Sodium: 139 mEq/L (ref 135–145)

## 2021-08-17 LAB — VITAMIN D 25 HYDROXY (VIT D DEFICIENCY, FRACTURES): VITD: 53.41 ng/mL (ref 30.00–100.00)

## 2021-08-17 LAB — TSH: TSH: 1.23 u[IU]/mL (ref 0.35–5.50)

## 2021-08-17 MED ORDER — TRULANCE 3 MG PO TABS: 1.0000 | ORAL_TABLET | Freq: Every day | ORAL | 1 refills | Status: DC

## 2021-08-17 NOTE — Patient Instructions (Signed)

## 2021-08-17 NOTE — Progress Notes (Addendum)
Subjective:  Patient ID: Larry Huang, male    DOB: 1946/02/07  Age: 74 y.o. MRN: [id number]  CC: Annual Exam, Hyperlipidemia, and Constipation  This visit occurred during the SARS-CoV-2 public health emergency.  Safety protocols were in place, including screening questions prior to the visit, additional usage of staff PPE, and extensive cleaning of exam room while observing appropriate contact time as indicated for disinfecting solutions.    HPI Larry Huang presents for a CPX and f/up -  He complains of a several year hx of constipation - only has a BM every 3 days. He is active and denies DOE, CP, edema, syncope, dizziness, lightheadedness.   Outpatient Medications Prior to Visit  Medication Sig Dispense Refill   aspirin EC 81 MG tablet Take 81 mg by mouth daily.     Cholecalciferol ([employer]S D3) 50 MCG (2000 UT) CAPS Take 1 capsule (2,000 Units total) by mouth daily. 180 capsule 1   pravastatin (PRAVACHOL) 40 MG tablet TAKE 1 TABLET BY MOUTH EVERY DAY 90 tablet 1   No facility-administered medications prior to visit.    ROS Review of Systems  Constitutional: Negative.  Negative for diaphoresis, fatigue and unexpected weight change.  HENT: Negative.  Negative for sore throat and trouble swallowing.   Eyes: Negative.   Respiratory: Negative.  Negative for cough, chest tightness, shortness of breath and wheezing.   Cardiovascular:  Negative for chest pain, palpitations and leg swelling.  Gastrointestinal:  Positive for constipation. Negative for abdominal pain, blood in stool, diarrhea and nausea.  Endocrine: Negative.   Genitourinary: Negative.  Negative for difficulty urinating and dysuria.  Musculoskeletal: Negative.   Skin: Negative.   Neurological:  Negative for dizziness, weakness, light-headedness and headaches.  Hematological:  Negative for adenopathy. Does not bruise/bleed easily.  Psychiatric/Behavioral: Negative.     Objective:  BP (!) 142/76   Pulse (!) 51    Temp 98 F (36.7 C) (Oral)   Ht 5\' 11"  (1.803 m)   Wt 175 lb (79.4 kg)   SpO2 97%   BMI 24.41 kg/m   BP Readings from Last 3 Encounters:  08/17/21 (!) 142/76  12/22/20 122/70  08/14/20 128/84    Wt Readings from Last 3 Encounters:  08/17/21 175 lb (79.4 kg)  12/22/20 174 lb 12.8 oz (79.3 kg)  08/14/20 176 lb (79.8 kg)    Physical Exam Vitals reviewed.  HENT:     Nose: Nose normal.     Mouth/Throat:     Mouth: Mucous membranes are moist.  Eyes:     General: No scleral icterus.    Conjunctiva/sclera: Conjunctivae normal.  Cardiovascular:     Rate and Rhythm: Regular rhythm. Bradycardia present.     Pulses: Normal pulses.     Heart sounds: No murmur heard.    Comments: EKG- Sinus bradycardia, 50 bpm Arm lead reversal unchanged Pulmonary:     Effort: Pulmonary effort is normal.     Breath sounds: No stridor. No wheezing, rhonchi or rales.  Abdominal:     General: Abdomen is flat.     Palpations: There is no mass.     Tenderness: There is no abdominal tenderness. There is no guarding or rebound.     Hernia: No hernia is present.  Musculoskeletal:        General: Normal range of motion.     Cervical back: Neck supple.     Right lower leg: No edema.     Left lower leg: No edema.  Lymphadenopathy:  Cervical: No cervical adenopathy.  Skin:    General: Skin is warm and dry.  Neurological:     General: No focal deficit present.     Mental Status: He is alert.  Psychiatric:        Mood and Affect: Mood normal.        Behavior: Behavior normal.    Lab Results  Component Value Date   WBC 7.9 08/17/2021   HGB 15.9 08/17/2021   HCT 46.9 08/17/2021   PLT 224.0 08/17/2021   GLUCOSE 91 08/17/2021   CHOL 162 08/17/2021   TRIG 103.0 08/17/2021   HDL 42.20 08/17/2021   LDLCALC 99 08/17/2021   ALT 17 08/17/2021   AST 21 08/17/2021   NA 139 08/17/2021   K 4.3 08/17/2021   CL 104 08/17/2021   CREATININE 1.14 08/17/2021   BUN 17 08/17/2021   CO2 29 08/17/2021    TSH 1.23 08/17/2021   PSA 0.91 08/14/2020    No results found.  Assessment & Plan:   Larry Huang was seen today for annual exam, hyperlipidemia and constipation.  Diagnoses and all orders for this visit:  Essential hypertension, benign- His BP is well controlled. -     CBC with Differential/Platelet; Future -     CBC with Differential/Platelet -     EKG 12-Lead  Gastroesophageal reflux disease without esophagitis- No sx's or complications. -     CBC with Differential/Platelet; Future -     CBC with Differential/Platelet  Stage 3a chronic kidney disease ([place])- Renal fxn is stable. -     Basic metabolic panel; Future -     Lipid panel; Future -     Urinalysis, Routine w reflex microscopic; Future -     Urinalysis, Routine w reflex microscopic -     Lipid panel -     Basic metabolic panel  Hyperlipidemia with target LDL less than 130- LDL goal achieved. Doing well on the statin  -     Hepatic function panel; Future -     Hepatic function panel  Chronic lymphocytic leukemia ([place])- CBC is normal. -     CBC with Differential/Platelet; Future -     CBC with Differential/Platelet  Bradycardia- He is asx. Labs are negative for secondary causes. No treatment indicated. -     TSH; Future -     TSH  Vitamin D deficiency disease- D is normal now. -     VITAMIN D 25 Hydroxy (Vit-D Deficiency, Fractures); Future -     VITAMIN D 25 Hydroxy (Vit-D Deficiency, Fractures)  Routine general medical examination at a health care facility- Exam completed, labs reviewed, vaccines reviewed and updated, no cancer screenings indicated, pt ed material was given.  Chronic idiopathic constipation- Labs are negative for secondary causes. -     Plecanatide (TRULANCE) 3 MG TABS; Take 1 tablet by mouth daily.  Needs flu shot -     Flu Vaccine QUAD High Dose(Fluad)  I am having Larry M. Leveque "Tal" start on Trulance. I am also having him maintain his aspirin EC, Cholecalciferol, and  pravastatin.  Meds ordered this encounter  Medications   Plecanatide (TRULANCE) 3 MG TABS    Sig: Take 1 tablet by mouth daily.    Dispense:  90 tablet    Refill:  1      Follow-up: Return in about 6 months (around 02/14/2022).  [name], MD

## 2021-08-18 ENCOUNTER — Telehealth: Payer: Self-pay

## 2021-08-18 NOTE — Telephone Encounter (Signed)
Key: [id number]

## 2021-09-09 ENCOUNTER — Other Ambulatory Visit: Payer: Self-pay | Admitting: Internal Medicine

## 2021-09-09 DIAGNOSIS — E785 Hyperlipidemia, unspecified: Secondary | ICD-10-CM

## 2021-10-28 DIAGNOSIS — L812 Freckles: Secondary | ICD-10-CM | POA: Diagnosis not present

## 2021-10-28 DIAGNOSIS — L821 Other seborrheic keratosis: Secondary | ICD-10-CM | POA: Diagnosis not present

## 2021-10-28 DIAGNOSIS — D225 Melanocytic nevi of trunk: Secondary | ICD-10-CM | POA: Diagnosis not present

## 2021-10-28 DIAGNOSIS — L570 Actinic keratosis: Secondary | ICD-10-CM | POA: Diagnosis not present

## 2021-10-28 DIAGNOSIS — L853 Xerosis cutis: Secondary | ICD-10-CM | POA: Diagnosis not present

## 2021-10-28 DIAGNOSIS — L820 Inflamed seborrheic keratosis: Secondary | ICD-10-CM | POA: Diagnosis not present

## 2021-10-28 DIAGNOSIS — D1801 Hemangioma of skin and subcutaneous tissue: Secondary | ICD-10-CM | POA: Diagnosis not present

## 2021-12-31 ENCOUNTER — Ambulatory Visit (INDEPENDENT_AMBULATORY_CARE_PROVIDER_SITE_OTHER): Payer: Medicare PPO

## 2021-12-31 DIAGNOSIS — Z0000 Encounter for general adult medical examination without abnormal findings: Secondary | ICD-10-CM

## 2021-12-31 NOTE — Progress Notes (Signed)
?I connected with Larry Huang today by telephone and verified that I am speaking with the correct person using two identifiers. ?Location patient: home ?Location provider: work ?Persons participating in the virtual visit: patient, provider. ?  ?I discussed the limitations, risks, security and privacy concerns of performing an evaluation and management service by telephone and the availability of in person appointments. I also discussed with the patient that there may be a patient responsible charge related to this service. The patient expressed understanding and verbally consented to this telephonic visit.  ?  ?Interactive audio and video telecommunications were attempted between this provider and patient, however failed, due to patient having technical difficulties OR patient did not have access to video capability.  We continued and completed visit with audio only. ? ?Some vital signs may be absent or patient reported.  ? ?Time Spent with patient on telephone encounter: 30 minutes ? ?Subjective:  ? Larry Huang is a 76 y.o. male who presents for Medicare Annual/Subsequent preventive examination. ? ?Review of Systems    ? ?Cardiac Risk Factors include: advanced age (>48mn, >>36women);dyslipidemia;hypertension;male gender ? ?   ?Objective:  ?  ?There were no vitals filed for this visit. ?There is no height or weight on file to calculate BMI. ? ? ?  12/31/2021  ?  3:12 PM 12/22/2020  ?  9:04 AM 08/14/2019  ? 10:38 AM 08/09/2016  ?  7:09 AM 05/13/2016  ? 10:42 AM 08/11/2015  ?  9:13 AM 05/15/2015  ? 12:06 PM  ?Advanced Directives  ?Does Patient Have a Medical Advance Directive? Yes Yes Yes No;Yes Yes Yes No  ?Type of AParamedicof AEldredLiving will HAmb[place]Living will H[place]Living will [place]Living will [place]Living will HMer[place]Living will   ?Does patient want to make changes to  medical advance directive? No - Patient declined No - Patient declined  No - Patient declined No - Patient declined No - Patient declined   ?Copy of [place] Chart? No - copy requested No - copy requested No - copy requested Yes No - copy requested Yes   ?Would patient like information on creating a medical advance directive?    No - patient declined information   No - patient declined information  ? ? ?Current Medications (verified) ?Outpatient Encounter Medications as of 12/31/2021  ?Medication Sig  ? aspirin EC 81 MG tablet Take 81 mg by mouth daily.  ? Cholecalciferol (CVS D3) 50 MCG (2000 UT) CAPS Take 1 capsule (2,000 Units total) by mouth daily.  ? pravastatin (PRAVACHOL) 40 MG tablet TAKE 1 TABLET BY MOUTH EVERY DAY  ? [DISCONTINUED] Plecanatide (TRULANCE) 3 MG TABS Take 1 tablet by mouth daily. (Patient not taking: Reported on 12/31/2021)  ? ?No facility-administered encounter medications on file as of 12/31/2021.  ? ? ?Allergies (verified) ?Benazepril and Lipitor [atorvastatin]  ? ?History: ?Past Medical History:  ?Diagnosis Date  ? CLL (chronic lymphoblastic leukemia)   ? Contact lens/glasses fitting   ? wears contacts or glasses  ? GERD (gastroesophageal reflux disease)   ? HTN (hypertension)   ? Hyperlipidemia   ? Monoclonal B-cell lymphocytosis 05/15/2015  ? Snores   ? ?Past Surgical History:  ?Procedure Laterality Date  ? COLONOSCOPY    ? MICROLARYNGOSCOPY WITH CO2 LASER AND EXCISION OF VOCAL CORD LESION Right 09/04/2013  ? Procedure: MICROLARYNGOSCOPY WITH EXCISION OF VOCAL CORD LESION;  Surgeon: CRozetta Nunnery  MD;  Location: [place];  Service: ENT;  Laterality: Right;  ? TONSILLECTOMY    ? ?Family History  ?Problem Relation Age of Onset  ? Cancer Father   ?     lung ca  ? Lung cancer Other   ? Hypertension Other   ? Cancer Other   ?     Lung Cancer  ? ?Social History  ? ?Socioeconomic History  ? Marital status: Married  ?  Spouse name: Not on file  ? Number of  children: 2  ? Years of education: Not on file  ? Highest education level: Not on file  ?Occupational History  ? Occupation: Media planner at [employer]  ?  Employer: RETIRED  ?Tobacco Use  ? Smoking status: Never  ? Smokeless tobacco: Never  ?Vaping Use  ? Vaping Use: Never used  ?Substance and Sexual Activity  ? Alcohol use: No  ?  Comment: rare  ? Drug use: No  ? Sexual activity: Yes  ?Other Topics Concern  ? Not on file  ?Social History Narrative  ? Regular Exercise -  YES  ? ?Social Determinants of Health  ? ?Financial Resource Strain: Low Risk   ? Difficulty of Paying Living Expenses: Not hard at all  ?Food Insecurity: No Food Insecurity  ? Worried About Charity fundraiser in the Last Year: Never true  ? Ran Out of Food in the Last Year: Never true  ?Transportation Needs: No Transportation Needs  ? Lack of Transportation (Medical): No  ? Lack of Transportation (Non-Medical): No  ?Physical Activity: Sufficiently Active  ? Days of Exercise per Week: 7 days  ? Minutes of Exercise per Session: 60 min  ?Stress: No Stress Concern Present  ? Feeling of Stress : Not at all  ?Social Connections: Socially Integrated  ? Frequency of Communication with Friends and Family: More than three times a week  ? Frequency of Social Gatherings with Friends and Family: More than three times a week  ? Attends Religious Services: More than 4 times per year  ? Active Member of Clubs or Organizations: Yes  ? Attends Archivist Meetings: More than 4 times per year  ? Marital Status: Married  ? ? ?Tobacco Counseling ?Counseling given: Not Answered ? ? ?Clinical Intake: ? ?Pre-visit preparation completed: Yes ? ?Pain : No/denies pain ? ?  ? ?Nutritional Risks: None ?Diabetes: No ? ?How often do you need to have someone help you when you read instructions, pamphlets, or other written materials from your doctor or pharmacy?: 1 - Never ?What is the last grade level you completed in school?: Masters  Degree ? ?Diabetic? no ? ?Interpreter Needed?: No ? ?Information entered by :: Lisette Abu, LPN ? ? ?Activities of Daily Living ? ?  12/31/2021  ?  3:14 PM  ?In your present state of health, do you have any difficulty performing the following activities:  ?Hearing? 0  ?Vision? 0  ?Difficulty concentrating or making decisions? 0  ?Walking or climbing stairs? 0  ?Dressing or bathing? 0  ?Doing errands, shopping? 0  ?Preparing Food and eating ? N  ?Using the Toilet? N  ?In the past six months, have you accidently leaked urine? N  ?Do you have problems with loss of bowel control? N  ?Managing your Medications? N  ?Managing your Finances? N  ?Housekeeping or managing your Housekeeping? N  ? ? ?Patient Care Team: ?Janith Lima, MD as PCP - General ?Heath Lark, MD as  Consulting Physician (Hematology and Oncology) ?Anell Barr, OD as Consulting Physician (Optometry) ? ?Indicate any recent Medical Services you may have received from other than [facility] providers in the past year (date may be approximate). ? ?   ?Assessment:  ? This is a routine wellness examination for Nain. ? ?Hearing/Vision screen ?Hearing Screening - Comments:: Patient denied any hearing difficulty.   ?No hearing aids. ? ?Vision Screening - Comments:: Patient does wear corrective lenses/contacts.  ?Eye exam done by: Dr. Anell Barr ? ?Dietary issues and exercise activities discussed: ?Current Exercise Habits: Home exercise routine, Time (Minutes): 60, Frequency (Times/Week): 7, Weekly Exercise (Minutes/Week): 420, Intensity: Moderate, Exercise limited by: None identified ? ? Goals Addressed   ? ?  ?  ?  ?  ? This Visit's Progress  ?  Patient Stated     ?  My goal is to keep my weight stable. ?  ? ?  ?Depression Screen ? ?  12/31/2021  ?  3:11 PM 12/22/2020  ?  9:05 AM 08/14/2020  ?  8:30 AM 08/14/2019  ? 10:39 AM 08/13/2018  ?  5:20 PM 08/10/2018  ? 11:17 AM 08/09/2017  ?  1:04 PM  ?PHQ 2/9 Scores  ?PHQ - 2 Score 0 0 0 0 0 0 0  ?  ?Fall  Risk ? ?  12/31/2021  ?  3:14 PM 12/22/2020  ?  9:04 AM 08/14/2020  ?  8:30 AM 08/14/2019  ? 10:39 AM 08/13/2018  ?  5:20 PM  ?Fall Risk   ?Falls in the past year? 0 0 0 0 0  ?Number falls in past yr: 0 0  0   ?Injury with Fall?

## 2021-12-31 NOTE — Patient Instructions (Signed)
Mr. Rockefeller , ?Thank you for taking time to come for your Medicare Wellness Visit. I appreciate your ongoing commitment to your health goals. Please review the following plan we discussed and let me know if I can assist you in the future.  ? ?Screening recommendations/referrals: ?Colonoscopy: Discontinued due to age ?Recommended yearly ophthalmology/optometry visit for glaucoma screening and checkup ?Recommended yearly dental visit for hygiene and checkup ? ?Vaccinations: ?Influenza vaccine: 08/17/2021 ?Pneumococcal vaccine: 08/05/2015, 08/05/2016 ?Tdap vaccine: 08/10/2018; due every 10 years ?Shingles vaccine: never done ?Zoster vaccine: 11/29/2012   ?Covid-19: 10/09/2019, 10/30/2019, 07/01/2020 ? ?Advanced directives: Please bring a copy of your health care power of attorney and living will to the office at your convenience. ? ?Conditions/risks identified: Yes ? ?Next appointment: Please schedule your next Medicare Wellness Visit with your Nurse Health Advisor in 1 year by calling [phone]. ? ?Preventive Care 35 Years and Older, Male ?Preventive care refers to lifestyle choices and visits with your health care provider that can promote health and wellness. ?What does preventive care include? ?A yearly physical exam. This is also called an annual well check. ?Dental exams once or twice a year. ?Routine eye exams. Ask your health care provider how often you should have your eyes checked. ?Personal lifestyle choices, including: ?Daily care of your teeth and gums. ?Regular physical activity. ?Eating a healthy diet. ?Avoiding tobacco and drug use. ?Limiting alcohol use. ?Practicing safe sex. ?Taking low doses of aspirin every day. ?Taking vitamin and mineral supplements as recommended by your health care provider. ?What happens during an annual well check? ?The services and screenings done by your health care provider during your annual well check will depend on your age, overall health, lifestyle risk factors, and family  history of disease. ?Counseling  ?Your health care provider may ask you questions about your: ?Alcohol use. ?Tobacco use. ?Drug use. ?Emotional well-being. ?Home and relationship well-being. ?Sexual activity. ?Eating habits. ?History of falls. ?Memory and ability to understand (cognition). ?Work and work Statistician. ?Screening  ?You may have the following tests or measurements: ?Height, weight, and BMI. ?Blood pressure. ?Lipid and cholesterol levels. These may be checked every 5 years, or more frequently if you are over 53 years old. ?Skin check. ?Lung cancer screening. You may have this screening every year starting at age 55 if you have a 30-pack-year history of smoking and currently smoke or have quit within the past 15 years. ?Fecal occult blood test (FOBT) of the stool. You may have this test every year starting at age 21. ?Flexible sigmoidoscopy or colonoscopy. You may have a sigmoidoscopy every 5 years or a colonoscopy every 10 years starting at age 61. ?Prostate cancer screening. Recommendations will vary depending on your family history and other risks. ?Hepatitis C blood test. ?Hepatitis B blood test. ?Sexually transmitted disease (STD) testing. ?Diabetes screening. This is done by checking your blood sugar (glucose) after you have not eaten for a while (fasting). You may have this done every 1-3 years. ?Abdominal aortic aneurysm (AAA) screening. You may need this if you are a current or former smoker. ?Osteoporosis. You may be screened starting at age 11 if you are at high risk. ?Talk with your health care provider about your test results, treatment options, and if necessary, the need for more tests. ?Vaccines  ?Your health care provider may recommend certain vaccines, such as: ?Influenza vaccine. This is recommended every year. ?Tetanus, diphtheria, and acellular pertussis (Tdap, Td) vaccine. You may need a Td booster every 10 years. ?Zoster vaccine. You may  need this after age 3. ?Pneumococcal  13-valent conjugate (PCV13) vaccine. One dose is recommended after age 65. ?Pneumococcal polysaccharide (PPSV23) vaccine. One dose is recommended after age 35. ?Talk to your health care provider about which screenings and vaccines you need and how often you need them. ?This information is not intended to replace advice given to you by your health care provider. Make sure you discuss any questions you have with your health care provider. ?Document Released: 10/10/2015 Document Revised: 06/02/2016 Document Reviewed: 07/15/2015 ?Elsevier Interactive Patient Education ? 2017 [employer]. ? ?Fall Prevention in the Home ?Falls can cause injuries. They can happen to people of all ages. There are many things you can do to make your home safe and to help prevent falls. ?What can I do on the outside of my home? ?Regularly fix the edges of walkways and driveways and fix any cracks. ?Remove anything that might make you trip as you walk through a door, such as a raised step or threshold. ?Trim any bushes or trees on the path to your home. ?Use bright outdoor lighting. ?Clear any walking paths of anything that might make someone trip, such as rocks or tools. ?Regularly check to see if handrails are loose or broken. Make sure that both sides of any steps have handrails. ?Any raised decks and porches should have guardrails on the edges. ?Have any leaves, snow, or ice cleared regularly. ?Use sand or salt on walking paths during winter. ?Clean up any spills in your garage right away. This includes oil or grease spills. ?What can I do in the bathroom? ?Use night lights. ?Install grab bars by the toilet and in the tub and shower. Do not use towel bars as grab bars. ?Use non-skid mats or decals in the tub or shower. ?If you need to sit down in the shower, use a plastic, non-slip stool. ?Keep the floor dry. Clean up any water that spills on the floor as soon as it happens. ?Remove soap buildup in the tub or shower regularly. ?Attach  bath mats securely with double-sided non-slip rug tape. ?Do not have throw rugs and other things on the floor that can make you trip. ?What can I do in the bedroom? ?Use night lights. ?Make sure that you have a light by your bed that is easy to reach. ?Do not use any sheets or blankets that are too big for your bed. They should not hang down onto the floor. ?Have a firm chair that has side arms. You can use this for support while you get dressed. ?Do not have throw rugs and other things on the floor that can make you trip. ?What can I do in the kitchen? ?Clean up any spills right away. ?Avoid walking on wet floors. ?Keep items that you use a lot in easy-to-reach places. ?If you need to reach something above you, use a strong step stool that has a grab bar. ?Keep electrical cords out of the way. ?Do not use floor polish or wax that makes floors slippery. If you must use wax, use non-skid floor wax. ?Do not have throw rugs and other things on the floor that can make you trip. ?What can I do with my stairs? ?Do not leave any items on the stairs. ?Make sure that there are handrails on both sides of the stairs and use them. Fix handrails that are broken or loose. Make sure that handrails are as long as the stairways. ?Check any carpeting to make sure that it is firmly attached  to the stairs. Fix any carpet that is loose or worn. ?Avoid having throw rugs at the top or bottom of the stairs. If you do have throw rugs, attach them to the floor with carpet tape. ?Make sure that you have a light switch at the top of the stairs and the bottom of the stairs. If you do not have them, ask someone to add them for you. ?What else can I do to help prevent falls? ?Wear shoes that: ?Do not have high heels. ?Have rubber bottoms. ?Are comfortable and fit you well. ?Are closed at the toe. Do not wear sandals. ?If you use a stepladder: ?Make sure that it is fully opened. Do not climb a closed stepladder. ?Make sure that both sides of the  stepladder are locked into place. ?Ask someone to hold it for you, if possible. ?Clearly mark and make sure that you can see: ?Any grab bars or handrails. ?First and last steps. ?Where the edge of each step is. ?[place]

## 2022-02-01 DIAGNOSIS — H25013 Cortical age-related cataract, bilateral: Secondary | ICD-10-CM | POA: Diagnosis not present

## 2022-03-04 ENCOUNTER — Other Ambulatory Visit: Payer: Self-pay | Admitting: Internal Medicine

## 2022-03-04 DIAGNOSIS — E785 Hyperlipidemia, unspecified: Secondary | ICD-10-CM

## 2022-04-28 DIAGNOSIS — Z87891 Personal history of nicotine dependence: Secondary | ICD-10-CM | POA: Diagnosis not present

## 2022-04-28 DIAGNOSIS — Z809 Family history of malignant neoplasm, unspecified: Secondary | ICD-10-CM | POA: Diagnosis not present

## 2022-04-28 DIAGNOSIS — N529 Male erectile dysfunction, unspecified: Secondary | ICD-10-CM | POA: Diagnosis not present

## 2022-04-28 DIAGNOSIS — E785 Hyperlipidemia, unspecified: Secondary | ICD-10-CM | POA: Diagnosis not present

## 2022-04-28 DIAGNOSIS — Z8673 Personal history of transient ischemic attack (TIA), and cerebral infarction without residual deficits: Secondary | ICD-10-CM | POA: Diagnosis not present

## 2022-07-07 DIAGNOSIS — H2513 Age-related nuclear cataract, bilateral: Secondary | ICD-10-CM | POA: Diagnosis not present

## 2022-08-18 ENCOUNTER — Ambulatory Visit (INDEPENDENT_AMBULATORY_CARE_PROVIDER_SITE_OTHER): Payer: Medicare PPO | Admitting: Internal Medicine

## 2022-08-18 ENCOUNTER — Encounter: Payer: Self-pay | Admitting: Internal Medicine

## 2022-08-18 VITALS — BP 110/80 | HR 54 | Temp 98.3°F | Ht 71.0 in | Wt 177.0 lb

## 2022-08-18 DIAGNOSIS — R001 Bradycardia, unspecified: Secondary | ICD-10-CM | POA: Diagnosis not present

## 2022-08-18 DIAGNOSIS — N1831 Chronic kidney disease, stage 3a: Secondary | ICD-10-CM | POA: Diagnosis not present

## 2022-08-18 DIAGNOSIS — I10 Essential (primary) hypertension: Secondary | ICD-10-CM

## 2022-08-18 DIAGNOSIS — K219 Gastro-esophageal reflux disease without esophagitis: Secondary | ICD-10-CM

## 2022-08-18 DIAGNOSIS — E785 Hyperlipidemia, unspecified: Secondary | ICD-10-CM | POA: Diagnosis not present

## 2022-08-18 DIAGNOSIS — Z23 Encounter for immunization: Secondary | ICD-10-CM

## 2022-08-18 DIAGNOSIS — C9110 Chronic lymphocytic leukemia of B-cell type not having achieved remission: Secondary | ICD-10-CM | POA: Diagnosis not present

## 2022-08-18 DIAGNOSIS — Z0001 Encounter for general adult medical examination with abnormal findings: Secondary | ICD-10-CM | POA: Diagnosis not present

## 2022-08-18 LAB — CBC WITH DIFFERENTIAL/PLATELET
Basophils Absolute: 0 10*3/uL (ref 0.0–0.1)
Basophils Relative: 0.5 % (ref 0.0–3.0)
Eosinophils Absolute: 0.3 10*3/uL (ref 0.0–0.7)
Eosinophils Relative: 4.5 % (ref 0.0–5.0)
HCT: 47.4 % (ref 39.0–52.0)
Hemoglobin: 16.3 g/dL (ref 13.0–17.0)
Lymphocytes Relative: 32.5 % (ref 12.0–46.0)
Lymphs Abs: 2.3 10*3/uL (ref 0.7–4.0)
MCHC: 34.4 g/dL (ref 30.0–36.0)
MCV: 95.7 fl (ref 78.0–100.0)
Monocytes Absolute: 0.5 10*3/uL (ref 0.1–1.0)
Monocytes Relative: 7.4 % (ref 3.0–12.0)
Neutro Abs: 3.9 10*3/uL (ref 1.4–7.7)
Neutrophils Relative %: 55.1 % (ref 43.0–77.0)
Platelets: 220 10*3/uL (ref 150.0–400.0)
RBC: 4.95 Mil/uL (ref 4.22–5.81)
RDW: 13.4 % (ref 11.5–15.5)
WBC: 7.1 10*3/uL (ref 4.0–10.5)

## 2022-08-18 LAB — URINALYSIS, ROUTINE W REFLEX MICROSCOPIC
Bilirubin Urine: NEGATIVE
Hgb urine dipstick: NEGATIVE
Ketones, ur: NEGATIVE
Leukocytes,Ua: NEGATIVE
Nitrite: NEGATIVE
RBC / HPF: NONE SEEN (ref 0–?)
Specific Gravity, Urine: 1.025 (ref 1.000–1.030)
Total Protein, Urine: NEGATIVE
Urine Glucose: NEGATIVE
Urobilinogen, UA: 0.2 (ref 0.0–1.0)
pH: 5.5 (ref 5.0–8.0)

## 2022-08-18 LAB — HEPATIC FUNCTION PANEL
ALT: 14 U/L (ref 0–53)
AST: 18 U/L (ref 0–37)
Albumin: 4.5 g/dL (ref 3.5–5.2)
Alkaline Phosphatase: 51 U/L (ref 39–117)
Bilirubin, Direct: 0.2 mg/dL (ref 0.0–0.3)
Total Bilirubin: 1.1 mg/dL (ref 0.2–1.2)
Total Protein: 7.1 g/dL (ref 6.0–8.3)

## 2022-08-18 LAB — LIPID PANEL
Cholesterol: 167 mg/dL (ref 0–200)
HDL: 42.3 mg/dL (ref >39.00)
LDL Cholesterol: 103 mg/dL — ABNORMAL HIGH (ref 0–99)
NonHDL: 125.12
Total CHOL/HDL Ratio: 4
Triglycerides: 109 mg/dL (ref 0.0–149.0)
VLDL: 21.8 mg/dL (ref 0.0–40.0)

## 2022-08-18 LAB — BASIC METABOLIC PANEL
BUN: 20 mg/dL (ref 6–23)
CO2: 29 mEq/L (ref 19–32)
Calcium: 9.3 mg/dL (ref 8.4–10.5)
Chloride: 104 mEq/L (ref 96–112)
Creatinine, Ser: 1.18 mg/dL (ref 0.40–1.50)
GFR: 59.07 mL/min — ABNORMAL LOW (ref >60.00)
Glucose, Bld: 91 mg/dL (ref 70–99)
Potassium: 4.2 mEq/L (ref 3.5–5.1)
Sodium: 139 mEq/L (ref 135–145)

## 2022-08-18 LAB — TSH: TSH: 1.98 u[IU]/mL (ref 0.35–5.50)

## 2022-08-18 NOTE — Progress Notes (Signed)
Subjective:  Patient ID: Larry Huang, male    DOB: 1946/03/03  Age: 76 y.o. MRN: [id number]  CC: Annual Exam and Hyperlipidemia   HPI Larry Huang presents for a CPX and f/up -  He walks about 2 miles every other day.  His endurance is good.  He denies chest pain, shortness of breath, diaphoresis, or edema.  Outpatient Medications Prior to Visit  Medication Sig Dispense Refill   Cholecalciferol (CVS D3) 50 MCG (2000 UT) CAPS Take 1 capsule (2,000 Units total) by mouth daily. 180 capsule 1   pravastatin (PRAVACHOL) 40 MG tablet TAKE 1 TABLET BY MOUTH EVERY DAY 90 tablet 1   aspirin EC 81 MG tablet Take 81 mg by mouth daily.     No facility-administered medications prior to visit.    ROS Review of Systems  Constitutional: Negative.  Negative for diaphoresis and fatigue.  HENT: Negative.    Eyes: Negative.   Respiratory: Negative.  Negative for chest tightness, shortness of breath and wheezing.   Cardiovascular:  Negative for chest pain, palpitations and leg swelling.  Gastrointestinal:  Negative for abdominal pain, diarrhea, nausea and vomiting.  Endocrine: Negative.   Genitourinary: Negative.   Musculoskeletal: Negative.   Skin: Negative.   Allergic/Immunologic: Negative.   Neurological: Negative.  Negative for dizziness, syncope, weakness and light-headedness.  Hematological:  Negative for adenopathy. Does not bruise/bleed easily.  Psychiatric/Behavioral: Negative.      Objective:  BP 110/80 (BP Location: Right Arm, Patient Position: Sitting, Cuff Size: Large)   Pulse (!) 54   Temp 98.3 F (36.8 C) (Oral)   Ht '5\' 11"'$  (1.803 m)   Wt 177 lb (80.3 kg)   SpO2 96%   BMI 24.69 kg/m   BP Readings from Last 3 Encounters:  08/18/22 110/80  08/17/21 (!) 142/76  12/22/20 122/70    Wt Readings from Last 3 Encounters:  08/18/22 177 lb (80.3 kg)  08/17/21 175 lb (79.4 kg)  12/22/20 174 lb 12.8 oz (79.3 kg)    Physical Exam Vitals reviewed.  HENT:      Mouth/Throat:     Mouth: Mucous membranes are moist.  Eyes:     General: No scleral icterus.    Conjunctiva/sclera: Conjunctivae normal.  Cardiovascular:     Rate and Rhythm: Regular rhythm. Bradycardia present.     Heart sounds: Normal heart sounds, S1 normal and S2 normal.     No friction rub. No gallop.     Comments: EKG- SB, 53 bpm Otherwise normal EKG Pulmonary:     Breath sounds: No stridor. No wheezing, rhonchi or rales.  Abdominal:     General: Abdomen is flat.     Palpations: There is no mass.     Tenderness: There is no abdominal tenderness. There is no guarding.     Hernia: No hernia is present.  Musculoskeletal:     Cervical back: Neck supple.     Right lower leg: No edema.     Left lower leg: No edema.  Lymphadenopathy:     Cervical: No cervical adenopathy.  Skin:    General: Skin is warm and dry.  Neurological:     General: No focal deficit present.     Mental Status: He is alert.  Psychiatric:        Mood and Affect: Mood normal.        Behavior: Behavior normal.     Lab Results  Component Value Date   WBC 7.1 08/18/2022   HGB 16.3  08/18/2022   HCT 47.4 08/18/2022   PLT 220.0 08/18/2022   GLUCOSE 91 08/18/2022   CHOL 167 08/18/2022   TRIG 109.0 08/18/2022   HDL 42.30 08/18/2022   LDLCALC 103 (H) 08/18/2022   ALT 14 08/18/2022   AST 18 08/18/2022   NA 139 08/18/2022   K 4.2 08/18/2022   CL 104 08/18/2022   CREATININE 1.18 08/18/2022   BUN 20 08/18/2022   CO2 29 08/18/2022   TSH 1.98 08/18/2022   PSA 0.91 08/14/2020    No results found.  Assessment & Plan:   Larry Huang was seen today for annual exam and hyperlipidemia.  Diagnoses and all orders for this visit:  Essential hypertension, benign- His blood pressure is well-controlled with lifestyle modifications. -     Basic metabolic panel; Future -     CBC with Differential/Platelet; Future -     Urinalysis, Routine w reflex microscopic; Future -     TSH; Future -     Hepatic function  panel; Future -     EKG 12-Lead -     Hepatic function panel -     TSH -     Urinalysis, Routine w reflex microscopic -     CBC with Differential/Platelet -     Basic metabolic panel  Gastroesophageal reflux disease without esophagitis- No symptoms or complications noted. -     CBC with Differential/Platelet; Future -     CBC with Differential/Platelet  Chronic lymphocytic leukemia ([place])- His CBC is reassuring. -     CBC with Differential/Platelet; Future -     Hepatic function panel; Future -     Hepatic function panel -     CBC with Differential/Platelet  Stage 3a chronic kidney disease ([place])- His renal function is stable and he is avoiding nephrotoxic agents. -     Basic metabolic panel; Future -     Urinalysis, Routine w reflex microscopic; Future -     Urinalysis, Routine w reflex microscopic -     Basic metabolic panel  Bradycardia- He is asymptomatic with this.  His EKG is reassuring.  -     TSH; Future -     EKG 12-Lead -     TSH  Hyperlipidemia with target LDL less than 130- LDL goal achieved. Doing well on the statin  -     Lipid panel; Future -     TSH; Future -     Hepatic function panel; Future -     Hepatic function panel -     TSH -     Lipid panel  Flu vaccine need -     Flu Vaccine QUAD High Dose(Fluad)  Encounter for general adult medical examination with abnormal findings- Exam completed, labs reviewed, vaccines reviewed and updated, no cancer screenings indicated, patient education was given.  Other orders -     Pneumococcal polysaccharide vaccine 23-valent greater than or equal to 2yo subcutaneous/IM   I have discontinued Larry M. Massi "Tal"'s aspirin EC. I am also having him maintain his Cholecalciferol and pravastatin.  No orders of the defined types were placed in this encounter.    Follow-up: Return in about 6 months (around 02/16/2023).  [name], MD

## 2022-08-18 NOTE — Patient Instructions (Signed)
Health Maintenance, Male Adopting a healthy lifestyle and getting preventive care are important in promoting health and wellness. Ask your health care provider about: The right schedule for you to have regular tests and exams. Things you can do on your own to prevent diseases and keep yourself healthy. What should I know about diet, weight, and exercise? Eat a healthy diet  Eat a diet that includes plenty of vegetables, fruits, low-fat dairy products, and lean protein. Do not eat a lot of foods that are high in solid fats, added sugars, or sodium. Maintain a healthy weight Body mass index (BMI) is a measurement that can be used to identify possible weight problems. It estimates body fat based on height and weight. Your health care provider can help determine your BMI and help you achieve or maintain a healthy weight. Get regular exercise Get regular exercise. This is one of the most important things you can do for your health. Most adults should: Exercise for at least 150 minutes each week. The exercise should increase your heart rate and make you sweat (moderate-intensity exercise). Do strengthening exercises at least twice a week. This is in addition to the moderate-intensity exercise. Spend less time sitting. Even light physical activity can be beneficial. Watch cholesterol and blood lipids Have your blood tested for lipids and cholesterol at 78 years of age, then have this test every 5 years. You may need to have your cholesterol levels checked more often if: Your lipid or cholesterol levels are high. You are older than 78 years of age. You are at high risk for heart disease. What should I know about cancer screening? Many types of cancers can be detected early and may often be prevented. Depending on your health history and family history, you may need to have cancer screening at various ages. This may include screening for: Colorectal cancer. Prostate cancer. Skin cancer. Lung  cancer. What should I know about heart disease, diabetes, and high blood pressure? Blood pressure and heart disease High blood pressure causes heart disease and increases the risk of stroke. This is more likely to develop in people who have high blood pressure readings or are overweight. Talk with your health care provider about your target blood pressure readings. Have your blood pressure checked: Every 3-5 years if you are 18-39 years of age. Every year if you are 40 years old or older. If you are between the ages of 65 and 75 and are a current or former smoker, ask your health care provider if you should have a one-time screening for abdominal aortic aneurysm (AAA). Diabetes Have regular diabetes screenings. This checks your fasting blood sugar level. Have the screening done: Once every three years after age 45 if you are at a normal weight and have a low risk for diabetes. More often and at a younger age if you are overweight or have a high risk for diabetes. What should I know about preventing infection? Hepatitis B If you have a higher risk for hepatitis B, you should be screened for this virus. Talk with your health care provider to find out if you are at risk for hepatitis B infection. Hepatitis C Blood testing is recommended for: Everyone born from 1945 through 1965. Anyone with known risk factors for hepatitis C. Sexually transmitted infections (STIs) You should be screened each year for STIs, including gonorrhea and chlamydia, if: You are sexually active and are younger than 78 years of age. You are older than 78 years of age and your   health care provider tells you that you are at risk for this type of infection. Your sexual activity has changed since you were last screened, and you are at increased risk for chlamydia or gonorrhea. Ask your health care provider if you are at risk. Ask your health care provider about whether you are at high risk for HIV. Your health care provider  may recommend a prescription medicine to help prevent HIV infection. If you choose to take medicine to prevent HIV, you should first get tested for HIV. You should then be tested every 3 months for as long as you are taking the medicine. Follow these instructions at home: Alcohol use Do not drink alcohol if your health care provider tells you not to drink. If you drink alcohol: Limit how much you have to 0-2 drinks a day. Know how much alcohol is in your drink. In the [place] one drink equals one 12 oz bottle of beer (355 mL), one 5 oz glass of wine (148 mL), or one 1 oz glass of hard liquor (44 mL). Lifestyle Do not use any products that contain nicotine or tobacco. These products include cigarettes, chewing tobacco, and vaping devices, such as e-cigarettes. If you need help quitting, ask your health care provider. Do not use street drugs. Do not share needles. Ask your health care provider for help if you need support or information about quitting drugs. General instructions Schedule regular health, dental, and eye exams. Stay current with your vaccines. Tell your health care provider if: You often feel depressed. You have ever been abused or do not feel safe at home. Summary Adopting a healthy lifestyle and getting preventive care are important in promoting health and wellness. Follow your health care provider's instructions about healthy diet, exercising, and getting tested or screened for diseases. Follow your health care provider's instructions on monitoring your cholesterol and blood pressure. This information is not intended to replace advice given to you by your health care provider. Make sure you discuss any questions you have with your health care provider. Document Revised: 02/02/2021 Document Reviewed: 02/02/2021 Elsevier Patient Education  2023 [employer]  

## 2022-08-22 DIAGNOSIS — Z0001 Encounter for general adult medical examination with abnormal findings: Secondary | ICD-10-CM | POA: Insufficient documentation

## 2022-10-18 ENCOUNTER — Other Ambulatory Visit: Payer: Self-pay | Admitting: Internal Medicine

## 2022-10-18 DIAGNOSIS — E785 Hyperlipidemia, unspecified: Secondary | ICD-10-CM

## 2023-01-03 ENCOUNTER — Ambulatory Visit (INDEPENDENT_AMBULATORY_CARE_PROVIDER_SITE_OTHER): Payer: Medicare PPO

## 2023-01-03 VITALS — Ht 71.0 in | Wt 174.0 lb

## 2023-01-03 DIAGNOSIS — Z0000 Encounter for general adult medical examination without abnormal findings: Secondary | ICD-10-CM | POA: Diagnosis not present

## 2023-01-03 NOTE — Patient Instructions (Addendum)
Larry Huang , Thank you for taking time to come for your Medicare Wellness Visit. I appreciate your ongoing commitment to your health goals. Please review the following plan we discussed and let me know if I can assist you in the future.   These are the goals we discussed:  Goals       Patient Stated (pt-stated)      My goal for 2024 is to maintain my health by staying physically active and keeping my weight stable.        This is a list of the screening recommended for you and due dates:  Health Maintenance  Topic Date Due   Zoster (Shingles) Vaccine (1 of 2) Never done   COVID-19 Vaccine (5 - 2023-24 season) 12/07/2022   Flu Shot  04/28/2023   Medicare Annual Wellness Visit  01/03/2024   DTaP/Tdap/Td vaccine (3 - Td or Tdap) 08/10/2028   Pneumonia Vaccine  Completed   Hepatitis C Screening: USPSTF Recommendation to screen - Ages 19-79 yo.  Completed   HPV Vaccine  Aged Out   Cologuard (Stool DNA test)  Discontinued    Advanced directives: Yes  Conditions/risks identified: Yes  Next appointment: Follow up in one year for your annual wellness visit.   Preventive Care 34 Years and Older, Male  Preventive care refers to lifestyle choices and visits with your health care provider that can promote health and wellness. What does preventive care include? A yearly physical exam. This is also called an annual well check. Dental exams once or twice a year. Routine eye exams. Ask your health care provider how often you should have your eyes checked. Personal lifestyle choices, including: Daily care of your teeth and gums. Regular physical activity. Eating a healthy diet. Avoiding tobacco and drug use. Limiting alcohol use. Practicing safe sex. Taking low doses of aspirin every day. Taking vitamin and mineral supplements as recommended by your health care provider. What happens during an annual well check? The services and screenings done by your health care provider during your  annual well check will depend on your age, overall health, lifestyle risk factors, and family history of disease. Counseling  Your health care provider may ask you questions about your: Alcohol use. Tobacco use. Drug use. Emotional well-being. Home and relationship well-being. Sexual activity. Eating habits. History of falls. Memory and ability to understand (cognition). Work and work Astronomer. Screening  You may have the following tests or measurements: Height, weight, and BMI. Blood pressure. Lipid and cholesterol levels. These may be checked every 5 years, or more frequently if you are over 95 years old. Skin check. Lung cancer screening. You may have this screening every year starting at age 60 if you have a 30-pack-year history of smoking and currently smoke or have quit within the past 15 years. Fecal occult blood test (FOBT) of the stool. You may have this test every year starting at age 19. Flexible sigmoidoscopy or colonoscopy. You may have a sigmoidoscopy every 5 years or a colonoscopy every 10 years starting at age 32. Prostate cancer screening. Recommendations will vary depending on your family history and other risks. Hepatitis C blood test. Hepatitis B blood test. Sexually transmitted disease (STD) testing. Diabetes screening. This is done by checking your blood sugar (glucose) after you have not eaten for a while (fasting). You may have this done every 1-3 years. Abdominal aortic aneurysm (AAA) screening. You may need this if you are a current or former smoker. Osteoporosis. You may be screened  starting at age 33 if you are at high risk. Talk with your health care provider about your test results, treatment options, and if necessary, the need for more tests. Vaccines  Your health care provider may recommend certain vaccines, such as: Influenza vaccine. This is recommended every year. Tetanus, diphtheria, and acellular pertussis (Tdap, Td) vaccine. You may need a Td  booster every 10 years. Zoster vaccine. You may need this after age 70. Pneumococcal 13-valent conjugate (PCV13) vaccine. One dose is recommended after age 16. Pneumococcal polysaccharide (PPSV23) vaccine. One dose is recommended after age 56. Talk to your health care provider about which screenings and vaccines you need and how often you need them. This information is not intended to replace advice given to you by your health care provider. Make sure you discuss any questions you have with your health care provider. Document Released: 10/10/2015 Document Revised: 06/02/2016 Document Reviewed: 07/15/2015 Elsevier Interactive Patient Education  2017 [place] Prevention in the Home Falls can cause injuries. They can happen to people of all ages. There are many things you can do to make your home safe and to help prevent falls. What can I do on the outside of my home? Regularly fix the edges of walkways and driveways and fix any cracks. Remove anything that might make you trip as you walk through a door, such as a raised step or threshold. Trim any bushes or trees on the path to your home. Use bright outdoor lighting. Clear any walking paths of anything that might make someone trip, such as rocks or tools. Regularly check to see if handrails are loose or broken. Make sure that both sides of any steps have handrails. Any raised decks and porches should have guardrails on the edges. Have any leaves, snow, or ice cleared regularly. Use sand or salt on walking paths during winter. Clean up any spills in your garage right away. This includes oil or grease spills. What can I do in the bathroom? Use night lights. Install grab bars by the toilet and in the tub and shower. Do not use towel bars as grab bars. Use non-skid mats or decals in the tub or shower. If you need to sit down in the shower, use a plastic, non-slip stool. Keep the floor dry. Clean up any water that spills on the floor  as soon as it happens. Remove soap buildup in the tub or shower regularly. Attach bath mats securely with double-sided non-slip rug tape. Do not have throw rugs and other things on the floor that can make you trip. What can I do in the bedroom? Use night lights. Make sure that you have a light by your bed that is easy to reach. Do not use any sheets or blankets that are too big for your bed. They should not hang down onto the floor. Have a firm chair that has side arms. You can use this for support while you get dressed. Do not have throw rugs and other things on the floor that can make you trip. What can I do in the kitchen? Clean up any spills right away. Avoid walking on wet floors. Keep items that you use a lot in easy-to-reach places. If you need to reach something above you, use a strong step stool that has a grab bar. Keep electrical cords out of the way. Do not use floor polish or wax that makes floors slippery. If you must use wax, use non-skid floor wax. Do not have throw  rugs and other things on the floor that can make you trip. What can I do with my stairs? Do not leave any items on the stairs. Make sure that there are handrails on both sides of the stairs and use them. Fix handrails that are broken or loose. Make sure that handrails are as long as the stairways. Check any carpeting to make sure that it is firmly attached to the stairs. Fix any carpet that is loose or worn. Avoid having throw rugs at the top or bottom of the stairs. If you do have throw rugs, attach them to the floor with carpet tape. Make sure that you have a light switch at the top of the stairs and the bottom of the stairs. If you do not have them, ask someone to add them for you. What else can I do to help prevent falls? Wear shoes that: Do not have high heels. Have rubber bottoms. Are comfortable and fit you well. Are closed at the toe. Do not wear sandals. If you use a stepladder: Make sure that it is  fully opened. Do not climb a closed stepladder. Make sure that both sides of the stepladder are locked into place. Ask someone to hold it for you, if possible. Clearly mark and make sure that you can see: Any grab bars or handrails. First and last steps. Where the edge of each step is. Use tools that help you move around (mobility aids) if they are needed. These include: Canes. Walkers. Scooters. Crutches. Turn on the lights when you go into a dark area. Replace any light bulbs as soon as they burn out. Set up your furniture so you have a clear path. Avoid moving your furniture around. If any of your floors are uneven, fix them. If there are any pets around you, be aware of where they are. Review your medicines with your doctor. Some medicines can make you feel dizzy. This can increase your chance of falling. Ask your doctor what other things that you can do to help prevent falls. This information is not intended to replace advice given to you by your health care provider. Make sure you discuss any questions you have with your health care provider. Document Released: 07/10/2009 Document Revised: 02/19/2016 Document Reviewed: 10/18/2014 Elsevier Interactive Patient Education  2017 [employer].

## 2023-01-03 NOTE — Progress Notes (Signed)
I connected with  Larry Huang on 01/03/23 by a audio enabled telemedicine application and verified that I am speaking with the correct person using two identifiers.  Patient Location: Home  Provider Location: Office/Clinic  I discussed the limitations of evaluation and management by telemedicine. The patient expressed understanding and agreed to proceed.  Patient Medicare AWV questionnaire was completed by the patient on 12/31/2022; I have confirmed that all information answered by patient is correct and no changes since this date.    Subjective:   Larry Huang is a 77 y.o. male who presents for Medicare Annual/Subsequent preventive examination.  Review of Systems     Cardiac Risk Factors include: advanced age (>54men, >40 women);dyslipidemia;family history of premature cardiovascular disease;male gender     Objective:    Today's Vitals   01/03/23 1031 01/03/23 1046  Weight:  174 lb (78.9 kg)  Height: 5\' 11"  (1.803 m) 5\' 11"  (1.803 m)  PainSc: 0-No pain 0-No pain   Body mass index is 24.27 kg/m.     01/03/2023   10:33 AM 12/31/2021    3:12 PM 12/22/2020    9:04 AM 08/14/2019   10:38 AM 08/09/2016    7:09 AM 05/13/2016   10:42 AM 08/11/2015    9:13 AM  Advanced Directives  Does Patient Have a Medical Advance Directive? Yes Yes Yes Yes No;Yes Yes Yes  Type of Estate agent of [place];Living will Healthcare Power of [place];Living will Healthcare Power of [place];Living will Healthcare Power of [place];Living will Healthcare Power of [place];Living will Healthcare Power of [place];Living will Healthcare Power of [place];Living will  Does patient want to make changes to medical advance directive?  No - Patient declined No - Patient declined  No - Patient declined No - Patient declined No - Patient declined  Copy of Healthcare Power of Attorney in Chart? No - copy requested No - copy requested No - copy requested No - copy requested Yes No - copy  requested Yes  Would patient like information on creating a medical advance directive?     No - patient declined information      Current Medications (verified) Outpatient Encounter Medications as of 01/03/2023  Medication Sig   Cholecalciferol ([facility] D3) 50 MCG (2000 UT) CAPS Take 1 capsule (2,000 Units total) by mouth daily.   pravastatin (PRAVACHOL) 40 MG tablet TAKE 1 TABLET BY MOUTH EVERY DAY   No facility-administered encounter medications on file as of 01/03/2023.    Allergies (verified) Benazepril and Lipitor [atorvastatin]   History: Past Medical History:  Diagnosis Date   CLL (chronic lymphoblastic leukemia)    Contact lens/glasses fitting    wears contacts or glasses   GERD (gastroesophageal reflux disease)    HTN (hypertension)    Hyperlipidemia    Monoclonal B-cell lymphocytosis 05/15/2015   Snores    Past Surgical History:  Procedure Laterality Date   COLONOSCOPY     MICROLARYNGOSCOPY WITH CO2 LASER AND EXCISION OF VOCAL CORD LESION Right 09/04/2013   Procedure: MICROLARYNGOSCOPY WITH EXCISION OF VOCAL CORD LESION;  Surgeon: Drema Halon, MD;  Location: [facility];  Service: ENT;  Laterality: Right;   TONSILLECTOMY     Family History  Problem Relation Age of Onset   Cancer Father        lung ca   Lung cancer Other    Hypertension Other    Cancer Other        Lung Cancer   Social History   Socioeconomic  History   Marital status: Married    Spouse name: Not on file   Number of children: 2   Years of education: Not on file   Highest education level: Not on file  Occupational History   Occupation: Facilities Management at [employer]    Employer: RETIRED  Tobacco Use   Smoking status: Never   Smokeless tobacco: Never  Vaping Use   Vaping Use: Never used  Substance and Sexual Activity   Alcohol use: No    Comment: rare   Drug use: No   Sexual activity: Yes  Other Topics Concern   Not on file  Social History Narrative    Regular Exercise -  YES   Social Determinants of Health   Financial Resource Strain: Low Risk  (01/03/2023)   Overall Financial Resource Strain (CARDIA)    Difficulty of Paying Living Expenses: Not hard at all  Food Insecurity: No Food Insecurity (01/03/2023)   Hunger Vital Sign    Worried About Running Out of Food in the Last Year: Never true    Ran Out of Food in the Last Year: Never true  Transportation Needs: No Transportation Needs (01/03/2023)   PRAPARE - Administrator, Civil ServiceTransportation    Lack of Transportation (Medical): No    Lack of Transportation (Non-Medical): No  Physical Activity: Sufficiently Active (01/03/2023)   Exercise Vital Sign    Days of Exercise per Week: 5 days    Minutes of Exercise per Session: 30 min  Stress: No Stress Concern Present (01/03/2023)   [employer] - Occupational Stress Questionnaire    Feeling of Stress : Only a little  Social Connections: Unknown (01/03/2023)   Social Connection and Isolation Panel [NHANES]    Frequency of Communication with Friends and Family: Once a week    Frequency of Social Gatherings with Friends and Family: More than three times a week    Attends Religious Services: Not on Marketing executivefile    Active Member of Clubs or Organizations: Yes    Attends Engineer, structuralClub or Organization Meetings: More than 4 times per year    Marital Status: Married    Tobacco Counseling Counseling given: Not Answered   Clinical Intake:  Pre-visit preparation completed: Yes  Pain : No/denies pain Pain Score: 0-No pain     BMI - recorded: 24.27 Nutritional Status: BMI of 19-24  Normal Nutritional Risks: None Diabetes: No  How often do you need to have someone help you when you read instructions, pamphlets, or other written materials from your doctor or pharmacy?: 1 - Never What is the last grade level you completed in school?: HSG  Diabetic? No  Interpreter Needed?: No  Information entered by :: Susie CassetteShenika Orest Dygert, LPN.   Activities of Daily  Living    01/03/2023   10:38 AM 12/31/2022   11:03 AM  In your present state of health, do you have any difficulty performing the following activities:  Hearing? 0 0  Vision? 0 0  Difficulty concentrating or making decisions? 0 0  Walking or climbing stairs? 0 0  Dressing or bathing? 0 0  Doing errands, shopping? 0 0  Preparing Food and eating ? N N  Using the Toilet? N N  In the past six months, have you accidently leaked urine? N N  Do you have problems with loss of bowel control? N N  Managing your Medications? N N  Managing your Finances? N N  Housekeeping or managing your Housekeeping? N N    Patient Care  Team: Etta Grandchild, MD as PCP - General Artis Delay, MD as Consulting Physician (Hematology and Oncology) Isla Pence, OD as Consulting Physician (Optometry)  Indicate any recent Medical Services you may have received from other than [facility] providers in the past year (date may be approximate).     Assessment:   This is a routine wellness examination for Czar.  Hearing/Vision screen Hearing Screening - Comments:: Denies hearing difficulties   Vision Screening - Comments:: Wears rx glasses - up to date with routine eye exams with Mateo Flow   Dietary issues and exercise activities discussed: Current Exercise Habits: Home exercise routine, Type of exercise: strength training/weights;walking, Time (Minutes): 60 (Walks 1-2 miles 5 days weekly and weight lifting 2 days a week), Frequency (Times/Week): 7, Weekly Exercise (Minutes/Week): 420, Intensity: Moderate, Exercise limited by: None identified   Goals Addressed               This Visit's Progress     Patient Stated (pt-stated)        My goal for 2024 is to maintain my health by staying physically active and keeping my weight stable.      Depression Screen    01/03/2023   10:43 AM 08/18/2022    8:04 AM 12/31/2021    3:11 PM 12/22/2020    9:05 AM 08/14/2020    8:30 AM 08/14/2019   10:39 AM 08/13/2018     5:20 PM  PHQ 2/9 Scores  PHQ - 2 Score 0 0 0 0 0 0 0  PHQ- 9 Score 0 0         Fall Risk    01/03/2023   10:36 AM 12/31/2022   11:03 AM 12/31/2021    3:14 PM 12/22/2020    9:04 AM 08/14/2020    8:30 AM  Fall Risk   Falls in the past year? 0 0 0 0 0  Number falls in past yr: 0 0 0 0   Injury with Fall? 0 0 0 0   Risk for fall due to : No Fall Risks  No Fall Risks No Fall Risks   Follow up Falls prevention discussed  Falls evaluation completed Falls evaluation completed     FALL RISK PREVENTION PERTAINING TO THE HOME:  Any stairs in or around the home? No  If so, are there any without handrails? No  Home free of loose throw rugs in walkways, pet beds, electrical cords, etc? Yes  Adequate lighting in your home to reduce risk of falls? Yes   ASSISTIVE DEVICES UTILIZED TO PREVENT FALLS:  Life alert? No  Use of a cane, walker or w/c? No  Grab bars in the bathroom? No  Shower chair or bench in shower? No  Elevated toilet seat or a handicapped toilet? No   TIMED UP AND GO:  Was the test performed? No . Telephonic Visit   Cognitive Function:        01/03/2023   10:43 AM 12/31/2021    3:14 PM 08/14/2019    9:11 AM  6CIT Screen  What Year? 0 points 0 points 0 points  What month? 0 points 0 points 0 points  What time? 0 points 0 points 0 points  Count back from 20 0 points 0 points 0 points  Months in reverse 0 points 0 points 0 points  Repeat phrase 0 points 0 points 0 points  Total Score 0 points 0 points 0 points    Immunizations Immunization History  Administered Date(s) Administered  Covid-19, Mrna,Vaccine(Spikevax)87yrs and older 10/12/2022   Fluad Quad(high Dose 65+) 06/07/2019, 08/14/2020, 08/17/2021, 08/18/2022   Influenza Split 07/19/2012   Influenza Whole 08/31/2010   Influenza, High Dose Seasonal PF 08/05/2016, 08/08/2017, 08/10/2018   Influenza,inj,Quad PF,6+ Mos 06/29/2013, 06/28/2014, 08/05/2015   PFIZER(Purple Top)SARS-COV-2 Vaccination 10/09/2019,  10/30/2019, 07/01/2020   Pneumococcal Conjugate-13 08/05/2015   Pneumococcal Polysaccharide-23 12/11/2008, 08/05/2016, 08/18/2022   Td 03/27/2008   Tdap 08/10/2018   Zoster, Live 11/29/2012    TDAP status: Up to date  Flu Vaccine status: Up to date  Pneumococcal vaccine status: Up to date  Covid-19 vaccine status: Completed vaccines  Qualifies for Shingles Vaccine? Yes   Zostavax completed Yes   Shingrix Completed?: No.    Education has been provided regarding the importance of this vaccine. Patient has been advised to call insurance company to determine out of pocket expense if they have not yet received this vaccine. Advised may also receive vaccine at local pharmacy or Health Dept. Verbalized acceptance and understanding.  Screening Tests Health Maintenance  Topic Date Due   Zoster Vaccines- Shingrix (1 of 2) Never done   COVID-19 Vaccine (5 - 2023-24 season) 12/07/2022   INFLUENZA VACCINE  04/28/2023   Medicare Annual Wellness (AWV)  01/03/2024   DTaP/Tdap/Td (3 - Td or Tdap) 08/10/2028   Pneumonia Vaccine 63+ Years old  Completed   Hepatitis C Screening  Completed   HPV VACCINES  Aged Out   Fecal DNA (Cologuard)  Discontinued    Health Maintenance  Health Maintenance Due  Topic Date Due   Zoster Vaccines- Shingrix (1 of 2) Never done   COVID-19 Vaccine (5 - 2023-24 season) 12/07/2022    Colorectal cancer screening: No longer required.   Lung Cancer Screening: (Low Dose CT Chest recommended if Age 2-80 years, 30 pack-year currently smoking OR have quit w/in 15years.) does not qualify.   Lung Cancer Screening Referral: No  Additional Screening:  Hepatitis C Screening: does qualify; Completed 08/05/2016  Vision Screening: Recommended annual ophthalmology exams for early detection of glaucoma and other disorders of the eye. Is the patient up to date with their annual eye exam?  Yes  Who is the provider or what is the name of the office in which the patient  attends annual eye exams? Mateo Flow, MD. If pt is not established with a provider, would they like to be referred to a provider to establish care? No .   Dental Screening: Recommended annual dental exams for proper oral hygiene  Community Resource Referral / Chronic Care Management: CRR required this visit?  No   CCM required this visit?  No      Plan:     I have personally reviewed and noted the following in the patient's chart:   Medical and social history Use of alcohol, tobacco or illicit drugs  Current medications and supplements including opioid prescriptions. Patient is not currently taking opioid prescriptions. Functional ability and status Nutritional status Physical activity Advanced directives List of other physicians Hospitalizations, surgeries, and ER visits in previous 12 months Vitals Screenings to include cognitive, depression, and falls Referrals and appointments  In addition, I have reviewed and discussed with patient certain preventive protocols, quality metrics, and best practice recommendations. A written personalized care plan for preventive services as well as general preventive health recommendations were provided to patient.     Mickeal Needy, LPN   10/02/1094    Nurse Notes:  Normal cognitive status assessed by direct observation via phone by this Nurse  Health Advisor. No abnormalities found.   Patient Medicare AWV questionnaire was completed by the patient on 12/31/2022; I have confirmed that all information answered by patient is correct and no changes since this date.

## 2023-01-10 DIAGNOSIS — H2513 Age-related nuclear cataract, bilateral: Secondary | ICD-10-CM | POA: Diagnosis not present

## 2023-01-10 DIAGNOSIS — H25043 Posterior subcapsular polar age-related cataract, bilateral: Secondary | ICD-10-CM | POA: Diagnosis not present

## 2023-01-10 DIAGNOSIS — H5703 Miosis: Secondary | ICD-10-CM | POA: Diagnosis not present

## 2023-01-10 DIAGNOSIS — H524 Presbyopia: Secondary | ICD-10-CM | POA: Diagnosis not present

## 2023-01-10 DIAGNOSIS — H2511 Age-related nuclear cataract, right eye: Secondary | ICD-10-CM | POA: Diagnosis not present

## 2023-01-10 DIAGNOSIS — H25013 Cortical age-related cataract, bilateral: Secondary | ICD-10-CM | POA: Diagnosis not present

## 2023-01-10 DIAGNOSIS — H52213 Irregular astigmatism, bilateral: Secondary | ICD-10-CM | POA: Diagnosis not present

## 2023-02-24 DIAGNOSIS — H269 Unspecified cataract: Secondary | ICD-10-CM | POA: Diagnosis not present

## 2023-02-24 DIAGNOSIS — Z809 Family history of malignant neoplasm, unspecified: Secondary | ICD-10-CM | POA: Diagnosis not present

## 2023-02-24 DIAGNOSIS — Z87891 Personal history of nicotine dependence: Secondary | ICD-10-CM | POA: Diagnosis not present

## 2023-02-24 DIAGNOSIS — N529 Male erectile dysfunction, unspecified: Secondary | ICD-10-CM | POA: Diagnosis not present

## 2023-02-24 DIAGNOSIS — E785 Hyperlipidemia, unspecified: Secondary | ICD-10-CM | POA: Diagnosis not present

## 2023-02-24 DIAGNOSIS — C9190 Lymphoid leukemia, unspecified not having achieved remission: Secondary | ICD-10-CM | POA: Diagnosis not present

## 2023-02-24 DIAGNOSIS — I10 Essential (primary) hypertension: Secondary | ICD-10-CM | POA: Diagnosis not present

## 2023-03-08 DIAGNOSIS — H268 Other specified cataract: Secondary | ICD-10-CM | POA: Diagnosis not present

## 2023-03-08 DIAGNOSIS — H5703 Miosis: Secondary | ICD-10-CM | POA: Diagnosis not present

## 2023-03-08 DIAGNOSIS — H2511 Age-related nuclear cataract, right eye: Secondary | ICD-10-CM | POA: Diagnosis not present

## 2023-03-09 ENCOUNTER — Encounter: Payer: Self-pay | Admitting: Internal Medicine

## 2023-03-10 DIAGNOSIS — H2511 Age-related nuclear cataract, right eye: Secondary | ICD-10-CM | POA: Diagnosis not present

## 2023-04-11 DIAGNOSIS — H35351 Cystoid macular degeneration, right eye: Secondary | ICD-10-CM | POA: Diagnosis not present

## 2023-04-11 DIAGNOSIS — Z961 Presence of intraocular lens: Secondary | ICD-10-CM | POA: Diagnosis not present

## 2023-04-11 DIAGNOSIS — H35033 Hypertensive retinopathy, bilateral: Secondary | ICD-10-CM | POA: Diagnosis not present

## 2023-04-13 ENCOUNTER — Encounter (INDEPENDENT_AMBULATORY_CARE_PROVIDER_SITE_OTHER): Payer: Medicare PPO | Admitting: Ophthalmology

## 2023-04-13 ENCOUNTER — Telehealth: Payer: Self-pay | Admitting: Internal Medicine

## 2023-04-13 ENCOUNTER — Other Ambulatory Visit: Payer: Self-pay | Admitting: Internal Medicine

## 2023-04-13 DIAGNOSIS — H43813 Vitreous degeneration, bilateral: Secondary | ICD-10-CM

## 2023-04-13 DIAGNOSIS — H2512 Age-related nuclear cataract, left eye: Secondary | ICD-10-CM

## 2023-04-13 DIAGNOSIS — E785 Hyperlipidemia, unspecified: Secondary | ICD-10-CM

## 2023-04-13 DIAGNOSIS — H59031 Cystoid macular edema following cataract surgery, right eye: Secondary | ICD-10-CM

## 2023-04-13 NOTE — Telephone Encounter (Signed)
Prescription Request  04/13/2023  LOV: 08/18/2022  What is the name of the medication or equipment? Pravastatin 40 mg   Have you contacted your pharmacy to request a refill? Yes   Which pharmacy would you like this sent to?  [id number] [place], [place] - [address] [place] [address] Phone: [phone] Fax: [phone]    Patient notified that their request is being sent to the clinical staff for review and that they should receive a response within 2 business days.   Please advise at Mobile [phone] (mobile)

## 2023-04-15 ENCOUNTER — Other Ambulatory Visit: Payer: Self-pay | Admitting: Internal Medicine

## 2023-04-15 DIAGNOSIS — E785 Hyperlipidemia, unspecified: Secondary | ICD-10-CM

## 2023-04-15 MED ORDER — PRAVASTATIN SODIUM 40 MG PO TABS: 40.0000 mg | ORAL_TABLET | Freq: Every day | ORAL | 1 refills | Status: DC

## 2023-05-06 ENCOUNTER — Ambulatory Visit: Payer: Medicare PPO | Admitting: Podiatry

## 2023-05-06 ENCOUNTER — Encounter: Payer: Self-pay | Admitting: Podiatry

## 2023-05-06 DIAGNOSIS — M79675 Pain in left toe(s): Secondary | ICD-10-CM | POA: Diagnosis not present

## 2023-05-06 DIAGNOSIS — M79674 Pain in right toe(s): Secondary | ICD-10-CM

## 2023-05-06 DIAGNOSIS — B351 Tinea unguium: Secondary | ICD-10-CM

## 2023-05-06 NOTE — Progress Notes (Signed)
  Subjective:  Patient ID: Larry Huang, male    DOB: 05-02-1946,  MRN: [id number]  Chief Complaint  Patient presents with   Nail Problem    Nail trim    77 y.o. male returns for the above complaint.  Patient presents with thickened elongated dystrophic toenails x10.  Patient would like to have them debrided down.  He is notably doing himself.  He denies any other acute complaints.  Objective:  There were no vitals filed for this visit. Podiatric Exam: Vascular: dorsalis pedis and posterior tibial pulses are palpable bilateral. Capillary return is immediate. Temperature gradient is WNL. Skin turgor WNL  Sensorium: Normal Semmes Weinstein monofilament test. Normal tactile sensation bilaterally. Nail Exam: Pt has thick disfigured discolored nails with subungual debris noted bilateral entire nail hallux through fifth toenails.  Pain on palpation to the nails. Ulcer Exam: There is no evidence of ulcer or pre-ulcerative changes or infection. Orthopedic Exam: Muscle tone and strength are WNL. No limitations in general ROM. No crepitus or effusions noted. HAV  B/L.  Hammer toes 2-5  B/L. Skin: No Porokeratosis. No infection or ulcers    Assessment & Plan:   1. Pain due to onychomycosis of toenails of both feet      Patient was evaluated and treated and all questions answered.  Onychomycosis with pain  -Nails palliatively debrided as below. -Educated on self-care  Procedure: Nail Debridement Rationale: pain  Type of Debridement: manual, sharp debridement. Instrumentation: Nail nipper, rotary burr. Number of Nails: 10  Procedures and Treatment: Consent by patient was obtained for treatment procedures. The patient understood the discussion of treatment and procedures well. All questions were answered thoroughly reviewed. Debridement of mycotic and hypertrophic toenails, 1 through 5 bilateral and clearing of subungual debris. No ulceration, no infection noted.  Return Visit-Office  Procedure: Patient instructed to return to the office for a follow up visit 3 months for continued evaluation and treatment.  Nicholes Rough, DPM    Return in about 3 months (around 08/06/2023) for RFC .

## 2023-05-11 ENCOUNTER — Encounter (INDEPENDENT_AMBULATORY_CARE_PROVIDER_SITE_OTHER): Payer: Medicare PPO | Admitting: Ophthalmology

## 2023-05-11 DIAGNOSIS — H43813 Vitreous degeneration, bilateral: Secondary | ICD-10-CM | POA: Diagnosis not present

## 2023-05-11 DIAGNOSIS — H59031 Cystoid macular edema following cataract surgery, right eye: Secondary | ICD-10-CM

## 2023-05-11 DIAGNOSIS — I10 Essential (primary) hypertension: Secondary | ICD-10-CM | POA: Diagnosis not present

## 2023-05-11 DIAGNOSIS — H35033 Hypertensive retinopathy, bilateral: Secondary | ICD-10-CM | POA: Diagnosis not present

## 2023-06-08 ENCOUNTER — Encounter (INDEPENDENT_AMBULATORY_CARE_PROVIDER_SITE_OTHER): Payer: Medicare PPO | Admitting: Ophthalmology

## 2023-06-08 DIAGNOSIS — H35033 Hypertensive retinopathy, bilateral: Secondary | ICD-10-CM | POA: Diagnosis not present

## 2023-06-08 DIAGNOSIS — I10 Essential (primary) hypertension: Secondary | ICD-10-CM | POA: Diagnosis not present

## 2023-06-08 DIAGNOSIS — H43813 Vitreous degeneration, bilateral: Secondary | ICD-10-CM | POA: Diagnosis not present

## 2023-06-08 DIAGNOSIS — H59031 Cystoid macular edema following cataract surgery, right eye: Secondary | ICD-10-CM | POA: Diagnosis not present

## 2023-08-10 DIAGNOSIS — H268 Other specified cataract: Secondary | ICD-10-CM | POA: Diagnosis not present

## 2023-08-10 DIAGNOSIS — H5703 Miosis: Secondary | ICD-10-CM | POA: Diagnosis not present

## 2023-08-10 DIAGNOSIS — H25012 Cortical age-related cataract, left eye: Secondary | ICD-10-CM | POA: Diagnosis not present

## 2023-08-10 DIAGNOSIS — H2512 Age-related nuclear cataract, left eye: Secondary | ICD-10-CM | POA: Diagnosis not present

## 2023-08-10 DIAGNOSIS — H25042 Posterior subcapsular polar age-related cataract, left eye: Secondary | ICD-10-CM | POA: Diagnosis not present

## 2023-08-10 DIAGNOSIS — H5709 Other anomalies of pupillary function: Secondary | ICD-10-CM | POA: Diagnosis not present

## 2023-08-11 DIAGNOSIS — H2512 Age-related nuclear cataract, left eye: Secondary | ICD-10-CM | POA: Diagnosis not present

## 2023-08-30 ENCOUNTER — Ambulatory Visit: Payer: Medicare PPO | Admitting: Internal Medicine

## 2023-08-30 ENCOUNTER — Encounter: Payer: Self-pay | Admitting: Internal Medicine

## 2023-08-30 VITALS — BP 162/94 | HR 57 | Temp 98.1°F | Resp 16 | Ht 71.0 in | Wt 176.4 lb

## 2023-08-30 DIAGNOSIS — E785 Hyperlipidemia, unspecified: Secondary | ICD-10-CM | POA: Diagnosis not present

## 2023-08-30 DIAGNOSIS — Z0001 Encounter for general adult medical examination with abnormal findings: Secondary | ICD-10-CM

## 2023-08-30 DIAGNOSIS — N1831 Chronic kidney disease, stage 3a: Secondary | ICD-10-CM | POA: Diagnosis not present

## 2023-08-30 DIAGNOSIS — K5904 Chronic idiopathic constipation: Secondary | ICD-10-CM

## 2023-08-30 DIAGNOSIS — I10 Essential (primary) hypertension: Secondary | ICD-10-CM | POA: Diagnosis not present

## 2023-08-30 DIAGNOSIS — Z0000 Encounter for general adult medical examination without abnormal findings: Secondary | ICD-10-CM

## 2023-08-30 DIAGNOSIS — Z23 Encounter for immunization: Secondary | ICD-10-CM | POA: Diagnosis not present

## 2023-08-30 DIAGNOSIS — C9110 Chronic lymphocytic leukemia of B-cell type not having achieved remission: Secondary | ICD-10-CM

## 2023-08-30 DIAGNOSIS — D72820 Lymphocytosis (symptomatic): Secondary | ICD-10-CM | POA: Diagnosis not present

## 2023-08-30 DIAGNOSIS — N400 Enlarged prostate without lower urinary tract symptoms: Secondary | ICD-10-CM | POA: Diagnosis not present

## 2023-08-30 DIAGNOSIS — R001 Bradycardia, unspecified: Secondary | ICD-10-CM

## 2023-08-30 DIAGNOSIS — K219 Gastro-esophageal reflux disease without esophagitis: Secondary | ICD-10-CM | POA: Diagnosis not present

## 2023-08-30 LAB — LIPID PANEL
Cholesterol: 173 mg/dL (ref 0–200)
HDL: 34.2 mg/dL — ABNORMAL LOW (ref >39.00)
LDL Cholesterol: 113 mg/dL — ABNORMAL HIGH (ref 0–99)
NonHDL: 138.72
Total CHOL/HDL Ratio: 5
Triglycerides: 130 mg/dL (ref 0.0–149.0)
VLDL: 26 mg/dL (ref 0.0–40.0)

## 2023-08-30 LAB — BASIC METABOLIC PANEL
BUN: 16 mg/dL (ref 6–23)
CO2: 29 meq/L (ref 19–32)
Calcium: 9.3 mg/dL (ref 8.4–10.5)
Chloride: 104 meq/L (ref 96–112)
Creatinine, Ser: 1.17 mg/dL (ref 0.40–1.50)
GFR: 59.25 mL/min — ABNORMAL LOW (ref >60.00)
Glucose, Bld: 89 mg/dL (ref 70–99)
Potassium: 4.3 meq/L (ref 3.5–5.1)
Sodium: 138 meq/L (ref 135–145)

## 2023-08-30 LAB — URINALYSIS, ROUTINE W REFLEX MICROSCOPIC
Bilirubin Urine: NEGATIVE
Hgb urine dipstick: NEGATIVE
Ketones, ur: NEGATIVE
Leukocytes,Ua: NEGATIVE
Nitrite: NEGATIVE
RBC / HPF: NONE SEEN (ref 0–?)
Specific Gravity, Urine: 1.025 (ref 1.000–1.030)
Total Protein, Urine: NEGATIVE
Urine Glucose: NEGATIVE
Urobilinogen, UA: 0.2 (ref 0.0–1.0)
pH: 6 (ref 5.0–8.0)

## 2023-08-30 LAB — HEPATIC FUNCTION PANEL
ALT: 16 U/L (ref 0–53)
AST: 19 U/L (ref 0–37)
Albumin: 4.5 g/dL (ref 3.5–5.2)
Alkaline Phosphatase: 56 U/L (ref 39–117)
Bilirubin, Direct: 0.2 mg/dL (ref 0.0–0.3)
Total Bilirubin: 1.3 mg/dL — ABNORMAL HIGH (ref 0.2–1.2)
Total Protein: 6.9 g/dL (ref 6.0–8.3)

## 2023-08-30 LAB — CBC WITH DIFFERENTIAL/PLATELET
Basophils Absolute: 0 10*3/uL (ref 0.0–0.1)
Basophils Relative: 0.6 % (ref 0.0–3.0)
Eosinophils Absolute: 0.3 10*3/uL (ref 0.0–0.7)
Eosinophils Relative: 4.1 % (ref 0.0–5.0)
HCT: 47.8 % (ref 39.0–52.0)
Hemoglobin: 16.5 g/dL (ref 13.0–17.0)
Lymphocytes Relative: 33.9 % (ref 12.0–46.0)
Lymphs Abs: 2.5 10*3/uL (ref 0.7–4.0)
MCHC: 34.4 g/dL (ref 30.0–36.0)
MCV: 95.1 fL (ref 78.0–100.0)
Monocytes Absolute: 0.5 10*3/uL (ref 0.1–1.0)
Monocytes Relative: 6.8 % (ref 3.0–12.0)
Neutro Abs: 4 10*3/uL (ref 1.4–7.7)
Neutrophils Relative %: 54.6 % (ref 43.0–77.0)
Platelets: 262 10*3/uL (ref 150.0–400.0)
RBC: 5.03 Mil/uL (ref 4.22–5.81)
RDW: 13.4 % (ref 11.5–15.5)
WBC: 7.4 10*3/uL (ref 4.0–10.5)

## 2023-08-30 LAB — PSA: PSA: 0.91 ng/mL (ref 0.10–4.00)

## 2023-08-30 LAB — TSH: TSH: 1.24 u[IU]/mL (ref 0.35–5.50)

## 2023-08-30 MED ORDER — SHINGRIX 50 MCG/0.5ML IM SUSR: 0.5000 mL | Freq: Once | INTRAMUSCULAR | 1 refills | Status: AC

## 2023-08-30 MED ORDER — TRULANCE 3 MG PO TABS: 1.0000 | ORAL_TABLET | Freq: Every day | ORAL | Status: DC

## 2023-08-30 NOTE — Progress Notes (Unsigned)
Subjective:  Patient ID: Larry Huang, male    DOB: 04-08-1946  Age: 77 y.o. MRN: [id number]  CC: Annual Exam, Hypertension, and Hyperlipidemia   HPI Macklen KESON CREW presents for a CPX and f/up ---  Discussed the use of AI scribe software for clinical note transcription with the patient, who gave verbal consent to proceed.  History of Present Illness   The patient, with a history of low heart rate, presents feeling well and maintaining an active lifestyle. He denies chest pain, shortness of breath, dizziness, lightheadedness, or balance issues. He reports feeling sleepy around 8:30 PM but denies any daytime fatigue.  He recently underwent cataract surgery and is currently using two types of eye drops, one four times a day and the other once a day. He continues to take pravastatin for cholesterol management and denies any associated muscle or joint aches.  Urination is reported as normal, with no signs of an enlarged prostate gland. However, he reports a slow bowel movement, sometimes going without a bowel movement for up to a week. He expresses interest in over-the-counter treatment for this issue but is not currently taking anything for constipation.  Despite his active lifestyle, which includes walking 1.5 to 2 miles five times a week and weightlifting three times a week, his blood pressure was recorded as 164/100, which is uncharacteristic for him. He is open to starting blood pressure medication if recommended.       Outpatient Medications Prior to Visit  Medication Sig Dispense Refill   Cholecalciferol ([facility]S D3) 50 MCG (2000 UT) CAPS Take 1 capsule (2,000 Units total) by mouth daily. 180 capsule 1   pravastatin (PRAVACHOL) 40 MG tablet Take 1 tablet (40 mg total) by mouth daily. 90 tablet 1   brimonidine (ALPHAGAN) 0.2 % ophthalmic solution Place 1 drop into the right eye 3 (three) times daily.     ketorolac (ACULAR) 0.5 % ophthalmic solution Place 1 drop into the right eye 4 (four)  times daily.     prednisoLONE acetate (PRED FORTE) 1 % ophthalmic suspension Place 1 drop into the right eye 4 (four) times daily.     No facility-administered medications prior to visit.    ROS Review of Systems  Objective:  BP (!) 162/94 (BP Location: Right Arm, Patient Position: Sitting, Cuff Size: Normal)   Pulse (!) 57   Temp 98.1 F (36.7 C) (Oral)   Resp 16   Ht 5\' 11"  (1.803 m)   Wt 176 lb 6.4 oz (80 kg)   SpO2 96%   BMI 24.60 kg/m   BP Readings from Last 3 Encounters:  08/30/23 (!) 162/94  08/18/22 110/80  08/17/21 (!) 142/76    Wt Readings from Last 3 Encounters:  08/30/23 176 lb 6.4 oz (80 kg)  01/03/23 174 lb (78.9 kg)  08/18/22 177 lb (80.3 kg)    Physical Exam Vitals reviewed.  Constitutional:      Appearance: Normal appearance.  HENT:     Mouth/Throat:     Mouth: Mucous membranes are moist.  Eyes:     General: No scleral icterus.    Conjunctiva/sclera: Conjunctivae normal.  Neck:     Vascular: No carotid bruit.  Cardiovascular:     Rate and Rhythm: Bradycardia present.     Heart sounds: No murmur heard.    No friction rub. No gallop.     Comments: EKG- SB, 52 bpm No LVH, Q waves, or ST/T waves  unchanged Pulmonary:     Effort: Pulmonary  effort is normal.     Breath sounds: No stridor. No wheezing, rhonchi or rales.  Abdominal:     General: Abdomen is flat.     Palpations: There is no mass.     Tenderness: There is no abdominal tenderness. There is no guarding.     Hernia: No hernia is present.  Musculoskeletal:        General: Normal range of motion.     Cervical back: Neck supple.     Right lower leg: No edema.     Left lower leg: No edema.  Skin:    General: Skin is warm and dry.  Neurological:     General: No focal deficit present.     Mental Status: He is alert. Mental status is at baseline.  Psychiatric:        Mood and Affect: Mood normal.        Behavior: Behavior normal.     Lab Results  Component Value Date   WBC  7.4 08/30/2023   HGB 16.5 08/30/2023   HCT 47.8 08/30/2023   PLT 262.0 08/30/2023   GLUCOSE 89 08/30/2023   CHOL 173 08/30/2023   TRIG 130.0 08/30/2023   HDL 34.20 (L) 08/30/2023   LDLCALC 113 (H) 08/30/2023   ALT 16 08/30/2023   AST 19 08/30/2023   NA 138 08/30/2023   K 4.3 08/30/2023   CL 104 08/30/2023   CREATININE 1.17 08/30/2023   BUN 16 08/30/2023   CO2 29 08/30/2023   TSH 1.24 08/30/2023   PSA 0.91 08/30/2023    No results found.  Assessment & Plan:  Chronic lymphocytic leukemia (HCC) -     CBC with Differential/Platelet; Future  Hyperlipidemia with target LDL less than 130 -     Lipid panel; Future -     TSH; Future -     Hepatic function panel; Future -     Pravastatin Sodium; Take 1 tablet (40 mg total) by mouth daily.  Dispense: 90 tablet; Refill: 1  Essential hypertension, benign -     TSH; Future -     Basic metabolic panel; Future  Benign prostatic hyperplasia without lower urinary tract symptoms -     PSA; Future -     Urinalysis, Routine w reflex microscopic; Future  Stage 3a chronic kidney disease (HCC) -     Urinalysis, Routine w reflex microscopic; Future -     Basic metabolic panel; Future  Gastroesophageal reflux disease without esophagitis -     Hepatic function panel; Future -     CBC with Differential/Platelet; Future  Monoclonal B-cell lymphocytosis -     CBC with Differential/Platelet; Future  Encounter for general adult medical examination with abnormal findings  Need for immunization against influenza -     Flu Vaccine Trivalent High Dose (Fluad)  Bradycardia -     EKG 12-Lead  Chronic idiopathic constipation -     Trulance; Take 1 tablet (3 mg total) by mouth daily.  Need for prophylactic vaccination and inoculation against varicella -     Shingrix; Inject 0.5 mLs into the muscle once for 1 dose.  Dispense: 0.5 mL; Refill: 1     Follow-up: Return in about 3 months (around 11/28/2023).  Sanda Linger, MD

## 2023-08-30 NOTE — Patient Instructions (Signed)
Health Maintenance, Male Adopting a healthy lifestyle and getting preventive care are important in promoting health and wellness. Ask your health care provider about: The right schedule for you to have regular tests and exams. Things you can do on your own to prevent diseases and keep yourself healthy. What should I know about diet, weight, and exercise? Eat a healthy diet  Eat a diet that includes plenty of vegetables, fruits, low-fat dairy products, and lean protein. Do not eat a lot of foods that are high in solid fats, added sugars, or sodium. Maintain a healthy weight Body mass index (BMI) is a measurement that can be used to identify possible weight problems. It estimates body fat based on height and weight. Your health care provider can help determine your BMI and help you achieve or maintain a healthy weight. Get regular exercise Get regular exercise. This is one of the most important things you can do for your health. Most adults should: Exercise for at least 150 minutes each week. The exercise should increase your heart rate and make you sweat (moderate-intensity exercise). Do strengthening exercises at least twice a week. This is in addition to the moderate-intensity exercise. Spend less time sitting. Even light physical activity can be beneficial. Watch cholesterol and blood lipids Have your blood tested for lipids and cholesterol at 79 years of age, then have this test every 5 years. You may need to have your cholesterol levels checked more often if: Your lipid or cholesterol levels are high. You are older than 79 years of age. You are at high risk for heart disease. What should I know about cancer screening? Many types of cancers can be detected early and may often be prevented. Depending on your health history and family history, you may need to have cancer screening at various ages. This may include screening for: Colorectal cancer. Prostate cancer. Skin cancer. Lung  cancer. What should I know about heart disease, diabetes, and high blood pressure? Blood pressure and heart disease High blood pressure causes heart disease and increases the risk of stroke. This is more likely to develop in people who have high blood pressure readings or are overweight. Talk with your health care provider about your target blood pressure readings. Have your blood pressure checked: Every 3-5 years if you are 18-39 years of age. Every year if you are 40 years old or older. If you are between the ages of 65 and 75 and are a current or former smoker, ask your health care provider if you should have a one-time screening for abdominal aortic aneurysm (AAA). Diabetes Have regular diabetes screenings. This checks your fasting blood sugar level. Have the screening done: Once every three years after age 45 if you are at a normal weight and have a low risk for diabetes. More often and at a younger age if you are overweight or have a high risk for diabetes. What should I know about preventing infection? Hepatitis B If you have a higher risk for hepatitis B, you should be screened for this virus. Talk with your health care provider to find out if you are at risk for hepatitis B infection. Hepatitis C Blood testing is recommended for: Everyone born from 1945 through 1965. Anyone with known risk factors for hepatitis C. Sexually transmitted infections (STIs) You should be screened each year for STIs, including gonorrhea and chlamydia, if: You are sexually active and are younger than 79 years of age. You are older than 79 years of age and your   health care provider tells you that you are at risk for this type of infection. Your sexual activity has changed since you were last screened, and you are at increased risk for chlamydia or gonorrhea. Ask your health care provider if you are at risk. Ask your health care provider about whether you are at high risk for HIV. Your health care provider  may recommend a prescription medicine to help prevent HIV infection. If you choose to take medicine to prevent HIV, you should first get tested for HIV. You should then be tested every 3 months for as long as you are taking the medicine. Follow these instructions at home: Alcohol use Do not drink alcohol if your health care provider tells you not to drink. If you drink alcohol: Limit how much you have to 0-2 drinks a day. Know how much alcohol is in your drink. In the [place] one drink equals one 12 oz bottle of beer (355 mL), one 5 oz glass of wine (148 mL), or one 1 oz glass of hard liquor (44 mL). Lifestyle Do not use any products that contain nicotine or tobacco. These products include cigarettes, chewing tobacco, and vaping devices, such as e-cigarettes. If you need help quitting, ask your health care provider. Do not use street drugs. Do not share needles. Ask your health care provider for help if you need support or information about quitting drugs. General instructions Schedule regular health, dental, and eye exams. Stay current with your vaccines. Tell your health care provider if: You often feel depressed. You have ever been abused or do not feel safe at home. Summary Adopting a healthy lifestyle and getting preventive care are important in promoting health and wellness. Follow your health care provider's instructions about healthy diet, exercising, and getting tested or screened for diseases. Follow your health care provider's instructions on monitoring your cholesterol and blood pressure. This information is not intended to replace advice given to you by your health care provider. Make sure you discuss any questions you have with your health care provider. Document Revised: 02/02/2021 Document Reviewed: 02/02/2021 Elsevier Patient Education  2024 Elsevier Inc.  

## 2023-08-31 MED ORDER — INDAPAMIDE 1.25 MG PO TABS: 1.2500 mg | ORAL_TABLET | Freq: Every day | ORAL | 0 refills | Status: DC

## 2023-08-31 MED ORDER — PRAVASTATIN SODIUM 40 MG PO TABS: 40.0000 mg | ORAL_TABLET | Freq: Every day | ORAL | 1 refills | Status: DC

## 2023-11-08 ENCOUNTER — Ambulatory Visit: Payer: Medicare PPO | Admitting: Podiatry

## 2023-11-08 ENCOUNTER — Encounter: Payer: Self-pay | Admitting: Podiatry

## 2023-11-08 VITALS — Ht 71.0 in

## 2023-11-08 DIAGNOSIS — B351 Tinea unguium: Secondary | ICD-10-CM

## 2023-11-08 DIAGNOSIS — M79675 Pain in left toe(s): Secondary | ICD-10-CM | POA: Diagnosis not present

## 2023-11-08 DIAGNOSIS — M79674 Pain in right toe(s): Secondary | ICD-10-CM | POA: Diagnosis not present

## 2023-11-14 ENCOUNTER — Encounter: Payer: Self-pay | Admitting: Podiatry

## 2023-11-14 NOTE — Progress Notes (Signed)
  Subjective:  Patient ID: Larry Huang, male    DOB: 09/30/1945,  MRN: [id number]  78 y.o. male presents to clinic with  painful elongated mycotic toenails 1-5 bilaterally which are tender when wearing enclosed shoe gear. Pain is relieved with periodic professional debridement.  Chief Complaint  Patient presents with   RFC    He is here for a nail trim, PCP is dr. Maisie Fus.    New problem(s): None   PCP is Etta Grandchild, MD.  Allergies  Allergen Reactions   Benazepril     cough   Lipitor [Atorvastatin]     myalgias    Review of Systems: Negative except as noted in the HPI.   Objective:  Larry Huang is a pleasant 78 y.o. male WD, WN in NAD. AAO x 3.  Vascular Examination: Vascular status intact b/l with palpable pedal pulses. CFT immediate b/l. No edema. No pain with calf compression b/l. Skin temperature gradient WNL b/l. No ischemia or gangrene noted b/l LE. No cyanosis or clubbing noted b/l LE.  Neurological Examination: Sensation grossly intact b/l with 10 gram monofilament. Vibratory sensation intact b/l.   Dermatological Examination: Pedal skin with normal turgor, texture and tone b/l. Toenails 1-5 b/l thick, discolored, elongated with subungual debris and pain on dorsal palpation. No hyperkeratotic lesions noted b/l.   Musculoskeletal Examination: Muscle strength 5/5 to b/l LE. HAV with bunion bilaterally and hammertoes 2-5 b/l.  Radiographs: None  Last A1c:       No data to display           Assessment:  No diagnosis found.  Plan:  Patient was evaluated and treated. All patient's and/or POA's questions/concerns addressed on today's visit. Toenails 1-5 debrided in length and girth without incident. Continue soft, supportive shoe gear daily. Report any pedal injuries to medical professional. Call office if there are any questions/concerns. -Patient/POA to call should there be question/concern in the interim.  Return in about 3 months (around  02/05/2024).  Freddie Breech, DPM       LOCATION: [address], [place] [address]                   Office [phone]   [facility] LOCATION: [address] [place], [place] [address] Office [phone]

## 2023-11-16 ENCOUNTER — Other Ambulatory Visit: Payer: Self-pay | Admitting: Internal Medicine

## 2023-11-16 DIAGNOSIS — I10 Essential (primary) hypertension: Secondary | ICD-10-CM

## 2023-11-17 MED ORDER — INDAPAMIDE 1.25 MG PO TABS: 1.2500 mg | ORAL_TABLET | Freq: Every day | ORAL | 0 refills | Status: DC

## 2024-01-04 ENCOUNTER — Ambulatory Visit (INDEPENDENT_AMBULATORY_CARE_PROVIDER_SITE_OTHER): Payer: Medicare PPO

## 2024-01-04 VITALS — Ht 71.0 in | Wt 174.0 lb

## 2024-01-04 DIAGNOSIS — Z0000 Encounter for general adult medical examination without abnormal findings: Secondary | ICD-10-CM

## 2024-01-04 NOTE — Patient Instructions (Addendum)
 Larry Huang , Thank you for taking time to come for your Medicare Wellness Visit. I appreciate your ongoing commitment to your health goals. Please review the following plan we discussed and let me know if I can assist you in the future.   Referrals/Orders/Follow-Ups/Clinician Recommendations: Aim for 30 minutes of exercise or brisk walking, 6-8 glasses of water, and 5 servings of fruits and vegetables each day.   This is a list of the screening recommended for you and due dates:  Health Maintenance  Topic Date Due   Zoster (Shingles) Vaccine (1 of 2) 01/18/1963   COVID-19 Vaccine (5 - 2024-25 season) 05/29/2023   Flu Shot  04/27/2024   Medicare Annual Wellness Visit  01/03/2025   DTaP/Tdap/Td vaccine (3 - Td or Tdap) 08/10/2028   Pneumonia Vaccine  Completed   Hepatitis C Screening  Completed   HPV Vaccine  Aged Out   Cologuard (Stool DNA test)  Discontinued    Advanced directives: (Copy Requested) Please bring a copy of your health care power of attorney and living will to the office to be added to your chart at your convenience. You can mail to [facility] [address] [place], [place] [address] or email to [email or web address]  Next Medicare Annual Wellness Visit scheduled for next year: Yes

## 2024-01-04 NOTE — Progress Notes (Signed)
 Subjective:   Larry Huang is a 78 y.o. who presents for a Medicare Wellness preventive visit.  Visit Complete: Virtual I connected with  Larry Huang on 01/04/24 by a audio enabled telemedicine application and verified that I am speaking with the correct person using two identifiers.  Patient Location: Home  Provider Location: Office/Clinic  I discussed the limitations of evaluation and management by telemedicine. The patient expressed understanding and agreed to proceed.  Vital Signs: Because this visit was a virtual/telehealth visit, some criteria may be missing or patient reported. Any vitals not documented were not able to be obtained and vitals that have been documented are patient reported.  VideoDeclined- This patient declined Librarian, academic. Therefore the visit was completed with audio only.  Persons Participating in Visit: Patient.  AWV Questionnaire: No: Patient Medicare AWV questionnaire was not completed prior to this visit.  Cardiac Risk Factors include: advanced age (>35men, >64 women);hypertension;dyslipidemia;male gender     Objective:    Today's Vitals   01/04/24 1013  Weight: 174 lb (78.9 kg)  Height: 5\' 11"  (1.803 m)   Body mass index is 24.27 kg/m.     01/04/2024   10:10 AM 01/03/2023   10:33 AM 12/31/2021    3:12 PM 12/22/2020    9:04 AM 08/14/2019   10:38 AM 08/09/2016    7:09 AM 05/13/2016   10:42 AM  Advanced Directives  Does Patient Have a Medical Advance Directive? Yes Yes Yes Yes Yes No;Yes Yes  Type of Estate agent of [place];Living will Healthcare Power of [place];Living will Healthcare Power of [place];Living will Healthcare Power of [place];Living will Healthcare Power of [place];Living will Healthcare Power of [place];Living will Healthcare Power of [place];Living will  Does patient want to make changes to medical advance directive?   No - Patient declined No - Patient declined   No - Patient declined No - Patient declined  Copy of Healthcare Power of Attorney in Chart? No - copy requested No - copy requested No - copy requested No - copy requested No - copy requested Yes No - copy requested  Would patient like information on creating a medical advance directive?      No - patient declined information     Current Medications (verified) Outpatient Encounter Medications as of 01/04/2024  Medication Sig   Cholecalciferol ([employer] D3) 50 MCG (2000 UT) CAPS Take 1 capsule (2,000 Units total) by mouth daily.   indapamide (LOZOL) 1.25 MG tablet Take 1 tablet (1.25 mg total) by mouth daily.   pravastatin (PRAVACHOL) 40 MG tablet Take 1 tablet (40 mg total) by mouth daily.   [DISCONTINUED] Plecanatide (TRULANCE) 3 MG TABS Take 1 tablet (3 mg total) by mouth daily.   No facility-administered encounter medications on file as of 01/04/2024.    Allergies (verified) Benazepril and Lipitor [atorvastatin]   History: Past Medical History:  Diagnosis Date   CLL (chronic lymphoblastic leukemia)    Contact lens/glasses fitting    wears contacts or glasses   GERD (gastroesophageal reflux disease)    HTN (hypertension)    Hyperlipidemia    Monoclonal B-cell lymphocytosis 05/15/2015   Snores    Past Surgical History:  Procedure Laterality Date   COLONOSCOPY     MICROLARYNGOSCOPY WITH CO2 LASER AND EXCISION OF VOCAL CORD LESION Right 09/04/2013   Procedure: MICROLARYNGOSCOPY WITH EXCISION OF VOCAL CORD LESION;  Surgeon: Drema Halon, MD;  Location: [facility];  Service: ENT;  Laterality: Right;  TONSILLECTOMY     Family History  Problem Relation Age of Onset   Cancer Father        lung ca   Lung cancer Other    Hypertension Other    Cancer Other        Lung Cancer   Social History   Socioeconomic History   Marital status: Married    Spouse name: Not on file   Number of children: 2   Years of education: Not on file   Highest education level: Not on  file  Occupational History   Occupation: Facilities Management at [employer]    Employer: RETIRED  Tobacco Use   Smoking status: Never    Passive exposure: Never   Smokeless tobacco: Never  Vaping Use   Vaping status: Never Used  Substance and Sexual Activity   Alcohol use: No    Comment: rare   Drug use: No   Sexual activity: Yes  Other Topics Concern   Not on file  Social History Narrative   Married   Regular Exercise -  YES   Social Drivers of Health   Financial Resource Strain: Low Risk  (01/04/2024)   Overall Financial Resource Strain (CARDIA)    Difficulty of Paying Living Expenses: Not hard at all  Food Insecurity: No Food Insecurity (01/04/2024)   Hunger Vital Sign    Worried About Running Out of Food in the Last Year: Never true    Ran Out of Food in the Last Year: Never true  Transportation Needs: No Transportation Needs (01/04/2024)   PRAPARE - Administrator, Civil Service (Medical): No    Lack of Transportation (Non-Medical): No  Physical Activity: Sufficiently Active (01/04/2024)   Exercise Vital Sign    Days of Exercise per Week: 5 days    Minutes of Exercise per Session: 30 min  Stress: No Stress Concern Present (01/04/2024)   [employer] of Occupational Health - Occupational Stress Questionnaire    Feeling of Stress : Not at all  Social Connections: Socially Integrated (01/04/2024)   Social Connection and Isolation Panel [NHANES]    Frequency of Communication with Friends and Family: More than three times a week    Frequency of Social Gatherings with Friends and Family: More than three times a week    Attends Religious Services: More than 4 times per year    Active Member of [employer] or Organizations: Yes    Attends Engineer, structural: More than 4 times per year    Marital Status: Married    Tobacco Counseling Counseling given: No    Clinical Intake:  Pre-visit preparation completed: Yes  Pain : No/denies pain      BMI - recorded: 24.27 Nutritional Risks: None Diabetes: No  No results found for: "HGBA1C"   How often do you need to have someone help you when you read instructions, pamphlets, or other written materials from your doctor or pharmacy?: 1 - Never  Interpreter Needed?: No  Information entered by :: Hassell Halim, CMA   Activities of Daily Living     01/04/2024   10:14 AM  In your present state of health, do you have any difficulty performing the following activities:  Hearing? 0  Vision? 0  Difficulty concentrating or making decisions? 0  Walking or climbing stairs? 0  Dressing or bathing? 0  Doing errands, shopping? 0  Preparing Food and eating ? N  Using the Toilet? N  In the past six months, have  you accidently leaked urine? N  Do you have problems with loss of bowel control? N  Managing your Medications? N  Managing your Finances? N  Housekeeping or managing your Housekeeping? N    Patient Care Team: Etta Grandchild, MD as PCP - Danella Maiers, MD as Consulting Physician (Hematology and Oncology) Isla Pence, OD as Consulting Physician (Optometry)  Indicate any recent Medical Services you may have received from other than [facility] providers in the past year (date may be approximate).     Assessment:   This is a routine wellness examination for [place].  Hearing/Vision screen Hearing Screening - Comments:: Denies hearing difficulties   Vision Screening - Comments:: Wears rx glasses - up to date with routine eye exams with Dr Clydene Pugh   Goals Addressed               This Visit's Progress     Patient Stated (pt-stated)        Patient stated he wants to keep his weight stable and keep exercising (staying active).       Depression Screen     01/04/2024   10:18 AM 01/03/2023   10:43 AM 08/18/2022    8:04 AM 12/31/2021    3:11 PM 12/22/2020    9:05 AM 08/14/2020    8:30 AM 08/14/2019   10:39 AM  PHQ 2/9 Scores  PHQ - 2 Score 0 0 0 0 0 0 0  PHQ- 9  Score 0 0 0        Fall Risk     01/04/2024   10:16 AM 01/03/2023   10:36 AM 12/31/2022   11:03 AM 12/31/2021    3:14 PM 12/22/2020    9:04 AM  Fall Risk   Falls in the past year? 0 0 0 0 0  Number falls in past yr: 0 0 0 0 0  Injury with Fall? 0 0 0 0 0  Risk for fall due to : No Fall Risks No Fall Risks  No Fall Risks No Fall Risks  Follow up Falls prevention discussed;Falls evaluation completed Falls prevention discussed  Falls evaluation completed Falls evaluation completed    MEDICARE RISK AT HOME:  Medicare Risk at Home Any stairs in or around the home?: No If so, are there any without handrails?: No Home free of loose throw rugs in walkways, pet beds, electrical cords, etc?: Yes Adequate lighting in your home to reduce risk of falls?: Yes Life alert?: No Use of a cane, walker or w/c?: No Grab bars in the bathroom?: No Shower chair or bench in shower?: No Elevated toilet seat or a handicapped toilet?: No  TIMED UP AND GO:  Was the test performed?  No  Cognitive Function: 6CIT completed        01/04/2024   10:16 AM 01/03/2023   10:43 AM 12/31/2021    3:14 PM 08/14/2019    9:11 AM  6CIT Screen  What Year? 0 points 0 points 0 points 0 points  What month? 0 points 0 points 0 points 0 points  What time? 0 points 0 points 0 points 0 points  Count back from 20 0 points 0 points 0 points 0 points  Months in reverse 0 points 0 points 0 points 0 points  Repeat phrase 0 points 0 points 0 points 0 points  Total Score 0 points 0 points 0 points 0 points    Immunizations Immunization History  Administered Date(s) Administered   Fluad Quad(high Dose 65+) 06/07/2019,  08/14/2020, 08/17/2021, 08/18/2022   Fluad Trivalent(High Dose 65+) 08/30/2023   Influenza Split 07/19/2012   Influenza Whole 08/31/2010   Influenza, High Dose Seasonal PF 08/05/2016, 08/08/2017, 08/10/2018   Influenza,inj,Quad PF,6+ Mos 06/29/2013, 06/28/2014, 08/05/2015   Moderna Covid-19 Fall Seasonal Vaccine  39yrs & older 10/12/2022   PFIZER(Purple Top)SARS-COV-2 Vaccination 10/09/2019, 10/30/2019, 07/01/2020   Pneumococcal Conjugate-13 08/05/2015   Pneumococcal Polysaccharide-23 12/11/2008, 08/05/2016, 08/18/2022   Td 03/27/2008   Tdap 08/10/2018   Zoster, Live 11/29/2012    Screening Tests Health Maintenance  Topic Date Due   Zoster Vaccines- Shingrix (1 of 2) 01/18/1963   COVID-19 Vaccine (5 - 2024-25 season) 05/29/2023   INFLUENZA VACCINE  04/27/2024   Medicare Annual Wellness (AWV)  01/03/2025   DTaP/Tdap/Td (3 - Td or Tdap) 08/10/2028   Pneumonia Vaccine 93+ Years old  Completed   Hepatitis C Screening  Completed   HPV VACCINES  Aged Out   Fecal DNA (Cologuard)  Discontinued    Health Maintenance  Health Maintenance Due  Topic Date Due   Zoster Vaccines- Shingrix (1 of 2) 01/18/1963   COVID-19 Vaccine (5 - 2024-25 season) 05/29/2023   Health Maintenance Items Addressed:01/04/2024   Additional Screening:  Vision Screening: Recommended annual ophthalmology exams for early detection of glaucoma and other disorders of the eye.  Dental Screening: Recommended annual dental exams for proper oral hygiene  Community Resource Referral / Chronic Care Management: CRR required this visit?    CCM required this visit?  No     Plan:     I have personally reviewed and noted the following in the patient's chart:   Medical and social history Use of alcohol, tobacco or illicit drugs  Current medications and supplements including opioid prescriptions. Patient is not currently taking opioid prescriptions. Functional ability and status Nutritional status Physical activity Advanced directives List of other physicians Hospitalizations, surgeries, and ER visits in previous 12 months Vitals Screenings to include cognitive, depression, and falls Referrals and appointments  In addition, I have reviewed and discussed with patient certain preventive protocols, quality metrics, and  best practice recommendations. A written personalized care plan for preventive services as well as general preventive health recommendations were provided to patient.     Darreld Mclean, CMA   01/04/2024   After Visit Summary: (MyChart) Due to this being a telephonic visit, the after visit summary with patients personalized plan was offered to patient via MyChart   Notes: Nothing significant to report at this time.

## 2024-01-11 DIAGNOSIS — H26493 Other secondary cataract, bilateral: Secondary | ICD-10-CM | POA: Diagnosis not present

## 2024-02-17 ENCOUNTER — Other Ambulatory Visit: Payer: Self-pay | Admitting: Internal Medicine

## 2024-02-17 DIAGNOSIS — I10 Essential (primary) hypertension: Secondary | ICD-10-CM

## 2024-03-14 ENCOUNTER — Encounter: Payer: Self-pay | Admitting: Podiatry

## 2024-03-14 ENCOUNTER — Ambulatory Visit: Payer: Medicare PPO | Admitting: Podiatry

## 2024-03-14 DIAGNOSIS — B351 Tinea unguium: Secondary | ICD-10-CM | POA: Diagnosis not present

## 2024-03-14 DIAGNOSIS — M79674 Pain in right toe(s): Secondary | ICD-10-CM

## 2024-03-14 DIAGNOSIS — M79675 Pain in left toe(s): Secondary | ICD-10-CM

## 2024-03-14 NOTE — Progress Notes (Unsigned)
  Subjective:  Patient ID: Larry Huang, male    DOB: 10/23/1945,  MRN: [id number]  Larry Huang presents to clinic today for painful, elongated thickened toenails x 10 which are symptomatic when wearing enclosed shoe gear. This interferes with his/her daily activities.  Chief Complaint  Patient presents with   RFC    Rm15/ not diabetic/ Dr. Rochelle Chu  pcp last visit Nov. 2024   New problem(s): None.   PCP is Arcadio Knuckles, MD.  Allergies  Allergen Reactions   Benazepril      cough   Lipitor [Atorvastatin ]     myalgias    Review of Systems: Negative except as noted in the HPI.  Objective: No changes noted in today's physical examination. There were no vitals filed for this visit. Larry Huang is a pleasant 78 y.o. male WD, WN in NAD. AAO x 3.  Vascular Examination: Capillary refill time immediate b/l. Palpable pedal pulses. Pedal hair present b/l. No pain with calf compression b/l. Skin temperature gradient WNL b/l. No cyanosis or clubbing b/l. No ischemia or gangrene noted b/l. No edema noted b/l LE.  Neurological Examination: Sensation grossly intact b/l with 10 gram monofilament. Vibratory sensation intact b/l.   Dermatological Examination: Pedal skin with normal turgor, texture and tone b/l.  No open wounds. No interdigital macerations.   Toenails 1-5 b/l thick, discolored, elongated with subungual debris and pain on dorsal palpation.   No corns, calluses nor porokeratotic lesions noted.  Musculoskeletal Examination: Muscle strength 5/5 to all lower extremity muscle groups bilaterally. HAV with bunion deformity noted b/l LE. Hammertoe deformity noted 2-5 b/l.Aaron Aas No pain, crepitus or joint limitation noted with ROM b/l LE.  Patient ambulates independently without assistive aids.  Radiographs: None  Assessment/Plan: 1. Pain due to onychomycosis of toenails of both feet   Consent given for treatment. Patient examined. All patient's and/or POA's questions/concerns  addressed on today's visit.Toenails 1-5 debrided in length and girth without incident. Continue soft, supportive shoe gear daily. Report any pedal injuries to medical professional. Call office if there are any questions/concerns. -Patient/POA to call should there be question/concern in the interim.   Return in about 3 months (around 06/14/2024).  Luella Sager, DPM      [place] LOCATION: [address] [place] [address]                   Office [phone]   [facility] LOCATION: [address] [place], [place] [address] Office [phone]

## 2024-04-01 ENCOUNTER — Other Ambulatory Visit: Payer: Self-pay | Admitting: Internal Medicine

## 2024-04-01 DIAGNOSIS — E785 Hyperlipidemia, unspecified: Secondary | ICD-10-CM

## 2024-05-07 ENCOUNTER — Encounter: Payer: Self-pay | Admitting: Family Medicine

## 2024-05-07 ENCOUNTER — Ambulatory Visit: Payer: Self-pay | Admitting: Internal Medicine

## 2024-05-07 ENCOUNTER — Ambulatory Visit: Admitting: Family Medicine

## 2024-05-07 VITALS — BP 134/80 | HR 64 | Temp 98.7°F | Ht 71.0 in | Wt 176.0 lb

## 2024-05-07 DIAGNOSIS — K5904 Chronic idiopathic constipation: Secondary | ICD-10-CM

## 2024-05-07 NOTE — Patient Instructions (Addendum)
 4 oz prune juice, 4 oz apple juice, 1 cap miralax. Heat in microwave, may do this when you get home.   If no bowel moevement before bed, may do this again.   If no bowel movement by morning, may repeat.   Ok to use this once daily   May take magnesium glycinate at bedtime

## 2024-05-07 NOTE — Progress Notes (Signed)
 Acute Office Visit  Subjective:     Patient ID: Larry Huang, male    DOB: Nov 25, 1945, 78 y.o.   MRN: [id number]  Chief Complaint  Patient presents with   Constipation    HPI  Discussed the use of AI scribe software for clinical note transcription with the patient, who gave verbal consent to proceed.  History of Present Illness Larry Huang is an 78 year old male who presents with constipation issues.  Altered bowel habits - Constipation with inability to have a bowel movement since last Wednesday - Previous similar episode occurred, lasting one day longer than current episode  Therapeutic interventions for constipation - Used over-the-counter medications including Dulcolax and Miralax without relief - Drank coffee despite not being a regular coffee drinker, without improvement in symptoms  Dietary and fluid intake - Fluid intake includes orange juice, some water, milk with cereal, and ice cream  Medication and supplement use - Does not take magnesium  supplements - Uncertain about other medications that might affect bowel function     ROS Per HPI      Objective:    BP 134/80 (BP Location: Left Arm, Patient Position: Sitting)   Pulse 64   Temp 98.7 F (37.1 C) (Temporal)   Ht 5' 11 (1.803 m)   Wt 176 lb (79.8 kg)   SpO2 99%   BMI 24.55 kg/m    Physical Exam Vitals and nursing note reviewed.  Constitutional:      General: He is not in acute distress.    Appearance: Normal appearance.  HENT:     Head: Normocephalic and atraumatic.     Right Ear: External ear normal.     Left Ear: External ear normal.     Nose: Nose normal.     Mouth/Throat:     Mouth: Mucous membranes are moist.     Pharynx: Oropharynx is clear.  Eyes:     Extraocular Movements: Extraocular movements intact.  Cardiovascular:     Rate and Rhythm: Normal rate and regular rhythm.     Pulses: Normal pulses.  Pulmonary:     Effort: Pulmonary effort is normal.  Abdominal:      General: There is no distension.     Palpations: Abdomen is soft. There is no mass.     Tenderness: There is no abdominal tenderness. There is no guarding or rebound.     Hernia: No hernia is present.     Comments: BS decreased in RLQ, normal in all others  Musculoskeletal:        General: Normal range of motion.     Cervical back: Normal range of motion.     Right lower leg: No edema.     Left lower leg: No edema.  Lymphadenopathy:     Cervical: No cervical adenopathy.  Skin:    General: Skin is warm and dry.  Neurological:     General: No focal deficit present.     Mental Status: He is alert and oriented to person, place, and time.  Psychiatric:        Mood and Affect: Mood normal.        Behavior: Behavior normal.     No results found for any visits on 05/07/24.      Assessment & Plan:   Assessment and Plan Assessment & Plan Constipation Chronic constipation exacerbated since Wednesday. No bowel movement despite Dulcolax and Miralax. No bowel obstruction. Adequate fluid intake. - Mix 4 oz prune juice, 4 oz  apple juice, 1 cap Miralax; heat and consume at home, before bed, and in the morning if no bowel movement. - Continue regimen daily if needed; skip a day if bowel movements are too frequent. - Consider magnesium  supplements; adjust dosage based on bowel movement frequency. - Contact clinic by lunchtime next day if no improvement.     No orders of the defined types were placed in this encounter.    No orders of the defined types were placed in this encounter.   Return if symptoms worsen or fail to improve.  Corean LITTIE Ku, FNP

## 2024-05-07 NOTE — Telephone Encounter (Signed)
 FYI Only or Action Required?: FYI only for provider.  Patient was last seen in primary care on 08/30/2023 by Joshua Debby CROME, MD.  Called Nurse Triage reporting Constipation.  Symptoms began about a month ago.  Interventions attempted: OTC medications: Miralax, dulcolax .  Symptoms are: unchanged.  Triage Disposition: See Physician Within 24 Hours  Patient/caregiver understands and will follow disposition?: Yes                 Copied from CRM #[id number]. Topic: Clinical - Red Word Triage >> May 07, 2024  9:28 AM Rosina BIRCH wrote: Red Word that prompted transfer to Nurse Triage: having digestive issues, bowels have not been moving for a month and it is still ongoing, patient has been taking over the counter medications and some work and some does not Reason for Disposition  Last bowel movement (BM) > 4 days ago  Answer Assessment - Initial Assessment Questions STOOL PATTERN OR FREQUENCY: How often do you have a bowel movement (BM)?  (Normal range: 3 times a day to every 3 days)  When was your last BM?       Used to every third day; last bm was last Wednesday STRAINING: Do you have to strain to have a BM?      Try to strain but can't get anything to move ONSET: When did the constipation begin?     About a month ago RECTAL PAIN: Does your rectum hurt when the stool comes out? If Yes, ask: Do you have hemorrhoids? How bad is the pain?  (Scale 1-10; or mild, moderate, severe)     No BLOOD ON STOOLS: Has there been any blood on the toilet tissue or on the surface of the BM? If Yes, ask: When was the last time?     No, kind of runny CHRONIC CONSTIPATION: Is this a new problem for you?  If No, ask: How long have you had this problem? (days, weeks, months)      No been pretty regular all my adult life CHANGES IN DIET OR HYDRATION: Have there been any recent changes in your diet? How much fluids are you drinking on a daily basis?  How much have you  had to drink today?     No MEDICINES: Have you been taking any new medicines? Are you taking any narcotic pain medicines? (e.g., Dilaudid, morphine, Percocet, Vicodin)     Blood pressure medication- indapamide ; started 4-5 months ago LAXATIVES: Have you been using any stool softeners, laxatives, or enemas?  If Yes, ask What are you using, how often, and when was the last time?       Miralax, dulcolax ACTIVITY:  How much walking do you do every day?  Has your activity level decreased in the past week?        Walk 5 days a week MEDICAL HISTORY: Do you have a history of hemorrhoids, rectal fissures, rectal surgery, or rectal abscess?         No OTHER SYMPTOMS: Do you have any other symptoms? (e.g., abdomen pain, bloating, fever, vomiting)       No  Protocols used: Constipation-A-AH

## 2024-06-27 DIAGNOSIS — M25512 Pain in left shoulder: Secondary | ICD-10-CM | POA: Diagnosis not present

## 2024-08-11 ENCOUNTER — Other Ambulatory Visit: Payer: Self-pay | Admitting: Internal Medicine

## 2024-08-11 DIAGNOSIS — I10 Essential (primary) hypertension: Secondary | ICD-10-CM

## 2024-09-03 ENCOUNTER — Encounter: Payer: Self-pay | Admitting: Internal Medicine

## 2024-09-03 ENCOUNTER — Ambulatory Visit: Admitting: Internal Medicine

## 2024-09-03 ENCOUNTER — Ambulatory Visit: Payer: Self-pay | Admitting: Internal Medicine

## 2024-09-03 VITALS — BP 138/76 | HR 61 | Temp 97.6°F | Resp 16 | Ht 71.0 in | Wt 172.6 lb

## 2024-09-03 DIAGNOSIS — K5904 Chronic idiopathic constipation: Secondary | ICD-10-CM

## 2024-09-03 DIAGNOSIS — Z23 Encounter for immunization: Secondary | ICD-10-CM

## 2024-09-03 DIAGNOSIS — N1831 Chronic kidney disease, stage 3a: Secondary | ICD-10-CM

## 2024-09-03 DIAGNOSIS — I10 Essential (primary) hypertension: Secondary | ICD-10-CM

## 2024-09-03 DIAGNOSIS — N400 Enlarged prostate without lower urinary tract symptoms: Secondary | ICD-10-CM

## 2024-09-03 DIAGNOSIS — E785 Hyperlipidemia, unspecified: Secondary | ICD-10-CM

## 2024-09-03 DIAGNOSIS — K219 Gastro-esophageal reflux disease without esophagitis: Secondary | ICD-10-CM

## 2024-09-03 DIAGNOSIS — Z0001 Encounter for general adult medical examination with abnormal findings: Secondary | ICD-10-CM

## 2024-09-03 LAB — BASIC METABOLIC PANEL WITH GFR
BUN: 19 mg/dL (ref 6–23)
CO2: 32 meq/L (ref 19–32)
Calcium: 9.7 mg/dL (ref 8.4–10.5)
Chloride: 100 meq/L (ref 96–112)
Creatinine, Ser: 1.25 mg/dL (ref 0.40–1.50)
GFR: 54.34 mL/min — ABNORMAL LOW (ref >60.00)
Glucose, Bld: 57 mg/dL — ABNORMAL LOW (ref 70–99)
Potassium: 4.1 meq/L (ref 3.5–5.1)
Sodium: 142 meq/L (ref 135–145)

## 2024-09-03 LAB — URINALYSIS, ROUTINE W REFLEX MICROSCOPIC
Bilirubin Urine: NEGATIVE
Hgb urine dipstick: NEGATIVE
Ketones, ur: NEGATIVE
Leukocytes,Ua: NEGATIVE
Nitrite: NEGATIVE
RBC / HPF: NONE SEEN (ref 0–?)
Specific Gravity, Urine: 1.02 (ref 1.000–1.030)
Total Protein, Urine: NEGATIVE
Urine Glucose: NEGATIVE
Urobilinogen, UA: 0.2 (ref 0.0–1.0)
pH: 6 (ref 5.0–8.0)

## 2024-09-03 LAB — HEPATIC FUNCTION PANEL
ALT: 13 U/L (ref 0–53)
AST: 18 U/L (ref 0–37)
Albumin: 4.4 g/dL (ref 3.5–5.2)
Alkaline Phosphatase: 59 U/L (ref 39–117)
Bilirubin, Direct: 0.1 mg/dL (ref 0.0–0.3)
Total Bilirubin: 0.8 mg/dL (ref 0.2–1.2)
Total Protein: 6.8 g/dL (ref 6.0–8.3)

## 2024-09-03 LAB — CBC WITH DIFFERENTIAL/PLATELET
Basophils Absolute: 0 K/uL (ref 0.0–0.1)
Basophils Relative: 0.6 % (ref 0.0–3.0)
Eosinophils Absolute: 0.3 K/uL (ref 0.0–0.7)
Eosinophils Relative: 4.7 % (ref 0.0–5.0)
HCT: 45.6 % (ref 39.0–52.0)
Hemoglobin: 15.8 g/dL (ref 13.0–17.0)
Lymphocytes Relative: 28.8 % (ref 12.0–46.0)
Lymphs Abs: 2 K/uL (ref 0.7–4.0)
MCHC: 34.6 g/dL (ref 30.0–36.0)
MCV: 94.6 fl (ref 78.0–100.0)
Monocytes Absolute: 0.6 K/uL (ref 0.1–1.0)
Monocytes Relative: 8.2 % (ref 3.0–12.0)
Neutro Abs: 4 K/uL (ref 1.4–7.7)
Neutrophils Relative %: 57.7 % (ref 43.0–77.0)
Platelets: 257 K/uL (ref 150.0–400.0)
RBC: 4.83 Mil/uL (ref 4.22–5.81)
RDW: 14.1 % (ref 11.5–15.5)
WBC: 7 K/uL (ref 4.0–10.5)

## 2024-09-03 LAB — LIPID PANEL
Cholesterol: 174 mg/dL (ref 0–200)
HDL: 35.2 mg/dL — ABNORMAL LOW (ref >39.00)
LDL Cholesterol: 94 mg/dL (ref 0–99)
NonHDL: 138.83
Total CHOL/HDL Ratio: 5
Triglycerides: 226 mg/dL — ABNORMAL HIGH (ref 0.0–149.0)
VLDL: 45.2 mg/dL — ABNORMAL HIGH (ref 0.0–40.0)

## 2024-09-03 LAB — TSH: TSH: 2.33 u[IU]/mL (ref 0.35–5.50)

## 2024-09-03 LAB — PSA: PSA: 2.44 ng/mL (ref 0.10–4.00)

## 2024-09-03 LAB — MAGNESIUM: Magnesium: 2.1 mg/dL (ref 1.5–2.5)

## 2024-09-03 MED ORDER — COVID-19 MRNA VAC-TRIS(PFIZER) 30 MCG/0.3ML IM SUSY: 0.3000 mL | PREFILLED_SYRINGE | Freq: Once | INTRAMUSCULAR | 0 refills | Status: AC

## 2024-09-03 MED ORDER — INDAPAMIDE 1.25 MG PO TABS: 1.2500 mg | ORAL_TABLET | Freq: Every day | ORAL | 1 refills | Status: AC

## 2024-09-03 MED ORDER — SHINGRIX 50 MCG/0.5ML IM SUSR: 0.5000 mL | Freq: Once | INTRAMUSCULAR | 1 refills | Status: AC

## 2024-09-03 MED ORDER — DAPAGLIFLOZIN PROPANEDIOL 10 MG PO TABS: 10.0000 mg | ORAL_TABLET | Freq: Every day | ORAL | 1 refills | Status: AC

## 2024-09-03 MED ORDER — PRAVASTATIN SODIUM 40 MG PO TABS: 40.0000 mg | ORAL_TABLET | Freq: Every day | ORAL | 1 refills | Status: AC

## 2024-09-03 NOTE — Progress Notes (Unsigned)
 Subjective:  Patient ID: Larry Huang, male    DOB: [birth year]  Age: 78 y.o. MRN: [id number]  CC: Annual Exam, Hypertension, and Hyperlipidemia   HPI Larry Huang presents for a CPX and f/up ---  Discussed the use of AI scribe software for clinical note transcription with the patient, who gave verbal consent to proceed.  History of Present Illness Larry Huang is an 78 year old male who presents for an annual physical exam.  He maintains an active lifestyle by walking four to five days a week, covering one and a half to two miles each time. He experiences no chest pain or shortness of breath during these activities. His energy levels are high from morning until mid-afternoon, with a slight decrease thereafter. Walking helps sustain his energy.  He recently experienced constipation and visited urgent care, after which he began taking a magnesium -based over-the-counter supplement. This resolved the issue, and his last bowel movement was yesterday.  He has a history of a rotator cuff issue for which he was given exercises at urgent care. He has resumed lifting weights and reports that his left arm is 'pretty good', although he experiences some difficulty when putting on clothes.  No side effects from his medications, including dizziness, lightheadedness, or muscle aches. He reports good sleep despite frequent awakenings, which he considers normal.     Outpatient Medications Prior to Visit  Medication Sig Dispense Refill   Cholecalciferol  (CVS D3) 50 MCG (2000 UT) CAPS Take 1 capsule (2,000 Units total) by mouth daily. 180 capsule 1   MAGNESIUM  PO Take by mouth.     indapamide  (LOZOL ) 1.25 MG tablet TAKE 1 TABLET BY MOUTH DAILY. 90 tablet 1   pravastatin  (PRAVACHOL ) 40 MG tablet TAKE 1 TABLET BY MOUTH EVERY DAY 90 tablet 1   No facility-administered medications prior to visit.    ROS Review of Systems  Objective:  BP 138/76 (BP Location: Left Arm, Patient Position:  Sitting, Cuff Size: Normal)   Pulse 61   Temp 97.6 F (36.4 C) (Oral)   Resp 16   Ht 5' 11 (1.803 m)   Wt 172 lb 9.6 oz (78.3 kg)   SpO2 94%   BMI 24.07 kg/m   BP Readings from Last 3 Encounters:  09/03/24 138/76  05/07/24 134/80  08/30/23 (!) 162/94    Wt Readings from Last 3 Encounters:  09/03/24 172 lb 9.6 oz (78.3 kg)  05/07/24 176 lb (79.8 kg)  01/04/24 174 lb (78.9 kg)    Physical Exam Cardiovascular:     Rate and Rhythm: Regular rhythm. Bradycardia present.     Comments: EKG--- SB, 57 bpm No LVH, Q waves, or ST/T wave changes  Unchanged  Abdominal:     General: Abdomen is flat. Bowel sounds are normal. There is no distension.     Palpations: Abdomen is soft. There is no hepatomegaly, splenomegaly or mass.     Tenderness: There is no abdominal tenderness.  Musculoskeletal:     Right lower leg: No edema.     Left lower leg: No edema.     Lab Results  Component Value Date   WBC 7.0 09/03/2024   HGB 15.8 09/03/2024   HCT 45.6 09/03/2024   PLT 257.0 09/03/2024   GLUCOSE 57 (L) 09/03/2024   CHOL 174 09/03/2024   TRIG 226.0 (H) 09/03/2024   HDL 35.20 (L) 09/03/2024   LDLCALC 94 09/03/2024   ALT 13 09/03/2024   AST 18 09/03/2024   NA  142 09/03/2024   K 4.1 09/03/2024   CL 100 09/03/2024   CREATININE 1.25 09/03/2024   BUN 19 09/03/2024   CO2 32 09/03/2024   TSH 2.33 09/03/2024   PSA 2.44 09/03/2024    Estimated Creatinine Clearance: 50.2 mL/min (by C-G formula based on SCr of 1.25 mg/dL).   Assessment & Plan:  Need for immunization against influenza -     Flu vaccine HIGH DOSE PF(Fluzone Trivalent)  Essential hypertension, benign -     EKG 12-Lead -     TSH; Future -     Urinalysis, Routine w reflex microscopic; Future -     CBC with Differential/Platelet; Future -     Basic metabolic panel with GFR; Future -     COVID-19 mRNA Vac-TriS(Pfizer); Inject 0.3 mLs into the muscle once for 1 dose.  Dispense: 0.3 mL; Refill: 0 -     Indapamide ;  Take 1 tablet (1.25 mg total) by mouth daily.  Dispense: 90 tablet; Refill: 1 -     AMB Referral [facility] Care Management  Encounter for general adult medical examination with abnormal findings  Chronic idiopathic constipation -     Basic metabolic panel with GFR; Future -     Magnesium ; Future  Benign prostatic hyperplasia without lower urinary tract symptoms -     PSA; Future -     Urinalysis, Routine w reflex microscopic; Future  Gastroesophageal reflux disease without esophagitis -     CBC with Differential/Platelet; Future  Hyperlipidemia with target LDL less than 130 -     Lipid panel; Future -     Hepatic function panel; Future -     Pravastatin  Sodium; Take 1 tablet (40 mg total) by mouth daily.  Dispense: 90 tablet; Refill: 1 -     AMB Referral [facility]I Care Management  Need for prophylactic vaccination and inoculation against varicella -     Shingrix ; Inject 0.5 mLs into the muscle once for 1 dose.  Dispense: 0.5 mL; Refill: 1  Stage 3a chronic kidney disease (HCC) -     Dapagliflozin  Propanediol; Take 1 tablet (10 mg total) by mouth daily.  Dispense: 90 tablet; Refill: 1 -     AMB Referral [facility]BCI Care Management     Follow-up: Return in about 6 months (around 03/04/2025).  Debby Molt, MD

## 2024-09-03 NOTE — Patient Instructions (Signed)
 Health Maintenance, Male  Adopting a healthy lifestyle and getting preventive care are important in promoting health and wellness. Ask your health care provider about:  The right schedule for you to have regular tests and exams.  Things you can do on your own to prevent diseases and keep yourself healthy.  What should I know about diet, weight, and exercise?  Eat a healthy diet    Eat a diet that includes plenty of vegetables, fruits, low-fat dairy products, and lean protein.  Do not eat a lot of foods that are high in solid fats, added sugars, or sodium.  Maintain a healthy weight  Body mass index (BMI) is a measurement that can be used to identify possible weight problems. It estimates body fat based on height and weight. Your health care provider can help determine your BMI and help you achieve or maintain a healthy weight.  Get regular exercise  Get regular exercise. This is one of the most important things you can do for your health. Most adults should:  Exercise for at least 150 minutes each week. The exercise should increase your heart rate and make you sweat (moderate-intensity exercise).  Do strengthening exercises at least twice a week. This is in addition to the moderate-intensity exercise.  Spend less time sitting. Even light physical activity can be beneficial.  Watch cholesterol and blood lipids  Have your blood tested for lipids and cholesterol at 80 years of age, then have this test every 5 years.  You may need to have your cholesterol levels checked more often if:  Your lipid or cholesterol levels are high.  You are older than 80 years of age.  You are at high risk for heart disease.  What should I know about cancer screening?  Many types of cancers can be detected early and may often be prevented. Depending on your health history and family history, you may need to have cancer screening at various ages. This may include screening for:  Colorectal cancer.  Prostate cancer.  Skin cancer.  Lung  cancer.  What should I know about heart disease, diabetes, and high blood pressure?  Blood pressure and heart disease  High blood pressure causes heart disease and increases the risk of stroke. This is more likely to develop in people who have high blood pressure readings or are overweight.  Talk with your health care provider about your target blood pressure readings.  Have your blood pressure checked:  Every 3-5 years if you are 24-52 years of age.  Every year if you are 3 years old or older.  If you are between the ages of 60 and 72 and are a current or former smoker, ask your health care provider if you should have a one-time screening for abdominal aortic aneurysm (AAA).  Diabetes  Have regular diabetes screenings. This checks your fasting blood sugar level. Have the screening done:  Once every three years after age 66 if you are at a normal weight and have a low risk for diabetes.  More often and at a younger age if you are overweight or have a high risk for diabetes.  What should I know about preventing infection?  Hepatitis B  If you have a higher risk for hepatitis B, you should be screened for this virus. Talk with your health care provider to find out if you are at risk for hepatitis B infection.  Hepatitis C  Blood testing is recommended for:  Everyone born from [birth year] through 1967.  Anyone  with known risk factors for hepatitis C.  Sexually transmitted infections (STIs)  You should be screened each year for STIs, including gonorrhea and chlamydia, if:  You are sexually active and are younger than 80 years of age.  You are older than 80 years of age and your health care provider tells you that you are at risk for this type of infection.  Your sexual activity has changed since you were last screened, and you are at increased risk for chlamydia or gonorrhea. Ask your health care provider if you are at risk.  Ask your health care provider about whether you are at high risk for HIV. Your health care provider  may recommend a prescription medicine to help prevent HIV infection. If you choose to take medicine to prevent HIV, you should first get tested for HIV. You should then be tested every 3 months for as long as you are taking the medicine.  Follow these instructions at home:  Alcohol use  Do not drink alcohol if your health care provider tells you not to drink.  If you drink alcohol:  Limit how much you have to 0-2 drinks a day.  Know how much alcohol is in your drink. In the [place] one drink equals one 12 oz bottle of beer (355 mL), one 5 oz glass of wine (148 mL), or one 1 oz glass of hard liquor (44 mL).  Lifestyle  Do not use any products that contain nicotine or tobacco. These products include cigarettes, chewing tobacco, and vaping devices, such as e-cigarettes. If you need help quitting, ask your health care provider.  Do not use street drugs.  Do not share needles.  Ask your health care provider for help if you need support or information about quitting drugs.  General instructions  Schedule regular health, dental, and eye exams.  Stay current with your vaccines.  Tell your health care provider if:  You often feel depressed.  You have ever been abused or do not feel safe at home.  Summary  Adopting a healthy lifestyle and getting preventive care are important in promoting health and wellness.  Follow your health care provider's instructions about healthy diet, exercising, and getting tested or screened for diseases.  Follow your health care provider's instructions on monitoring your cholesterol and blood pressure.  This information is not intended to replace advice given to you by your health care provider. Make sure you discuss any questions you have with your health care provider.  Document Revised: 02/02/2021 Document Reviewed: 02/02/2021  Elsevier Patient Education  2024 [employer].

## 2024-09-07 ENCOUNTER — Telehealth: Payer: Self-pay | Admitting: *Deleted

## 2024-09-07 NOTE — Progress Notes (Signed)
 Care Guide Pharmacy Note  09/07/2024 Name: PERKINS MOLINA MRN: [id number] DOB: [birth year]  Referred By: Joshua Debby CROME, MD Reason for referral: Call Attempt #1 and Complex Care Management (Outreach to schedule referral with pharmacist )   NOEL RODIER is a 78 y.o. year old male who is a primary care patient of Joshua Debby CROME, MD.  Rulon CHRISTELLA Constable was referred to the pharmacist for assistance related to: HTN  An unsuccessful telephone outreach was attempted today to contact the patient who was referred to the pharmacy team for assistance with medication management. Additional attempts will be made to contact the patient.  Thedford Franks, CMA [place]  [facility], [facility] Guide Direct Dial: [phone]  Fax: [phone] Website: [email or web address]

## 2024-09-07 NOTE — Progress Notes (Signed)
 Care Guide Pharmacy Note  09/07/2024 Name: DENZIL MCEACHRON MRN: [id number] DOB: 11-27-1945  Referred By: Joshua Debby CROME, MD Reason for referral: Call Attempt #1 and Complex Care Management (Outreach to schedule referral with pharmacist )   KIREE DEJARNETTE is a 78 y.o. year old male who is a primary care patient of Joshua Debby CROME, MD.  Rulon CHRISTELLA Constable was referred to the pharmacist for assistance related to: HTN  Successful contact was made with the patient to discuss pharmacy services including being ready for the pharmacist to call at least 5 minutes before the scheduled appointment time and to have medication bottles and any blood pressure readings ready for review. The patient agreed to meet with the pharmacist via telephone visit on 09/11/2024  Thedford Franks, CMA [place]  [facility], [facility] Guide Direct Dial: [phone]  Fax: [phone] Website: [email or web address]

## 2024-09-11 ENCOUNTER — Other Ambulatory Visit

## 2024-09-11 DIAGNOSIS — E785 Hyperlipidemia, unspecified: Secondary | ICD-10-CM

## 2024-09-11 DIAGNOSIS — N1831 Chronic kidney disease, stage 3a: Secondary | ICD-10-CM

## 2024-09-11 DIAGNOSIS — I10 Essential (primary) hypertension: Secondary | ICD-10-CM

## 2024-09-11 NOTE — Patient Instructions (Signed)
 It was a pleasure speaking with you today!  Continue current regimen.  For Farxiga  - Take this medication in the morning, as it can cause more frequent urination with more sugar in the urine. It may worsen risk for dehydration or genital infections. Focus on staying well hydrated and using good genital hygiene. Stop the medication and call our office if you develop any symptoms of genital infections, such as burning, itching, or pain while urinating or itching with redness that could be a yeast infection. If you have a day that you are vomiting or having diarrhea and you are very dehydrated, please hold this medication until you feel better.    Feel free to call with any questions or concerns!  Darrelyn Drum, PharmD, BCPS, CPP Clinical Pharmacist Practitioner [facility] at [facility] [phone]

## 2024-09-11 NOTE — Progress Notes (Signed)
° °  09/11/2024 Name: Larry Huang MRN: [id number] DOB: Jun 29, 1946  Chief Complaint  Patient presents with   Chronic Kidney Disease   Medication Management    Cougar CORDARIOUS ZEEK is a 78 y.o. year old male who presented for a telephone visit.   They were referred to the pharmacist by their PCP for assistance in managing hypertension and hyperlipidemia/cardiovascular risk reduction.   Referral dermatology - moles on face   Subjective:  Care Team: Primary Care Provider: Joshua Debby CROME, MD ; Next Scheduled Visit: none scheduled   Medication Access/Adherence  Current Pharmacy:  [id number] [address], [place] [id number] [place] [address] Phone: [phone] Fax: [phone]   Patient reports affordability concerns with their medications: No  Patient reports access/transportation concerns to their pharmacy: No  Patient reports adherence concerns with their medications:  No    Pt did start Farxiga  that was prescribed for renal protection. He notes it was $128 for 62 DS which is affordable for him at this time.  Hypertension:  Current medications: Indapamide  1.25 mg daily Medications previously tried: amlodipine , amlodipine -benazepril , Edarbi    Hyperlipidemia/ASCVD Risk Reduction  Current lipid lowering medications: pravastatin  40 mg daily Medications tried in the past: atorvastatin  10 mg  Antiplatelet regimen: none    Objective:  No results found for: HGBA1C  Lab Results  Component Value Date   CREATININE 1.25 09/03/2024   BUN 19 09/03/2024   NA 142 09/03/2024   K 4.1 09/03/2024   CL 100 09/03/2024   CO2 32 09/03/2024    Lab Results  Component Value Date   CHOL 174 09/03/2024   HDL 35.20 (L) 09/03/2024   LDLCALC 94 09/03/2024   TRIG 226.0 (H) 09/03/2024   CHOLHDL 5 09/03/2024    Medications Reviewed Today     Reviewed by Merceda Lela SAUNDERS, RPH (Pharmacist) on 09/11/24 at 1620  Med List Status: <None>   Medication  Order Taking? Sig Documenting Provider Last Dose Status Informant  Cholecalciferol  (CVS D3) 50 MCG (2000 UT) CAPS [id number]  Take 1 capsule (2,000 Units total) by mouth daily. Joshua Debby CROME, MD  Active   dapagliflozin  propanediol (FARXIGA ) 10 MG TABS tablet [id number] Yes Take 1 tablet (10 mg total) by mouth daily. Joshua Debby CROME, MD  Active   indapamide  (LOZOL ) 1.25 MG tablet [id number] Yes Take 1 tablet (1.25 mg total) by mouth daily. Joshua Debby CROME, MD  Active   MAGNESIUM  PO [id number]  Take by mouth. [provider]  Active   pravastatin  (PRAVACHOL ) 40 MG tablet [id number] Yes Take 1 tablet (40 mg total) by mouth daily. Joshua Debby CROME, MD  Active               Assessment/Plan:   Hypertension: - Currently borderline controlled, Goal BP <130/80 without hypotension symptoms - Consider addition or change to ACE or ARB for renal protection - Consider checking UACR   Hyperlipidemia/ASCVD Risk Reduction: - Currently controlled. LDL goal <100 - Recommend to continue current regimen   CKD  Reviewed SGLT2 inhibitor side effects - including reduced BP and risk of UTI Advised to hold medication if he experiences an illness where he is at risk of dehydration  Follow Up Plan: PRN  Darrelyn Merceda, PharmD, BCPS, CPP Clinical Pharmacist Practitioner [facility] at [facility] [phone]

## 2024-10-01 ENCOUNTER — Encounter: Payer: Self-pay | Admitting: Internal Medicine

## 2024-10-03 ENCOUNTER — Other Ambulatory Visit: Payer: Self-pay | Admitting: Internal Medicine

## 2024-10-03 DIAGNOSIS — L989 Disorder of the skin and subcutaneous tissue, unspecified: Secondary | ICD-10-CM

## 2024-10-26 ENCOUNTER — Encounter: Payer: Self-pay | Admitting: Dermatology

## 2024-10-26 ENCOUNTER — Ambulatory Visit: Admitting: Dermatology

## 2024-10-26 DIAGNOSIS — L820 Inflamed seborrheic keratosis: Secondary | ICD-10-CM | POA: Diagnosis not present

## 2024-10-26 DIAGNOSIS — W908XXA Exposure to other nonionizing radiation, initial encounter: Secondary | ICD-10-CM | POA: Diagnosis not present

## 2024-10-26 DIAGNOSIS — L578 Other skin changes due to chronic exposure to nonionizing radiation: Secondary | ICD-10-CM

## 2024-10-26 DIAGNOSIS — L821 Other seborrheic keratosis: Secondary | ICD-10-CM

## 2024-10-26 DIAGNOSIS — L570 Actinic keratosis: Secondary | ICD-10-CM | POA: Diagnosis not present

## 2024-10-26 NOTE — Patient Instructions (Addendum)

## 2024-10-26 NOTE — Progress Notes (Signed)
" ° ° ° °  New Patient Visit  History of Present Illness Larry Huang is an 79 year old male who presents with concerns about several spots on his face.  He has noticed several spots on his face, including one under his eye and another on the side of his face. He has not had any spots frozen before and has not experienced any previous treatments for these types of skin lesions.  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 He has not had skin cancer before.  He wears glasses, which may be causing friction and irritation to some of the spots.  Pt has various growths on his face he'd like evaluated. Pt has no hx of skin cancer  The following portions of the chart were reviewed this encounter and updated as appropriate: medications, allergies, medical history  Review of Systems:  No other skin or systemic complaints except as noted in HPI or Assessment and Plan.  Objective  Well appearing patient in no apparent distress; mood and affect are within normal limits.   A focused examination was performed of the following areas: face  Relevant exam findings are noted in the Assessment and Plan.  Left Malar Cheek (2) Erythematous thin papules/macules with gritty scale.  Right Buccal Cheek (3) Inflamed stuck on papules    Assessment & Plan   SEBORRHEIC KERATOSIS - Stuck-on, waxy, tan-brown papules and/or plaques  - Benign-appearing - Discussed benign etiology and prognosis. - Observe - Call for any changes  ACTINIC DAMAGE - chronic, secondary to cumulative UV radiation exposure/sun exposure over time - diffuse scaly erythematous macules with underlying dyspigmentation - Recommend daily broad spectrum sunscreen SPF 30+ to sun-exposed areas, reapply every 2 hours as needed.  - Recommend staying in the shade or wearing long sleeves, sun glasses (UVA+UVB protection) and  wide brim hats (4-inch brim around the entire circumference of the hat). - Call for new or changing lesions. AK (ACTINIC KERATOSIS) (2) Left Malar Cheek (2) - Destruction of lesion - Left Malar Cheek (2) Complexity: simple   Destruction method: cryotherapy   Informed consent: discussed and consent obtained   Outcome: patient tolerated procedure well with no complications   Post-procedure details: wound care instructions given    INFLAMED SEBORRHEIC KERATOSIS (3) Right Buccal Cheek (3) - Destruction of lesion - Right Buccal Cheek (3) Complexity: simple   Destruction method: cryotherapy   Outcome: patient tolerated procedure well with no complications   Post-procedure details: wound care instructions given    ACTINIC SKIN DAMAGE   SEBORRHEIC KERATOSIS    Return for TBSE w brenda.  I, Darice Smock, CMA, am acting as scribe for RUFUS CHRISTELLA HOLY, MD.   Documentation: I have reviewed the above documentation for accuracy and completeness, and I agree with the above.  RUFUS CHRISTELLA HOLY, MD    "

## 2024-12-12 ENCOUNTER — Ambulatory Visit: Admitting: Dermatology

## 2025-01-04 ENCOUNTER — Ambulatory Visit

## 2025-01-28 ENCOUNTER — Ambulatory Visit: Admitting: Physician Assistant
